# Patient Record
Sex: Male | Born: 1990 | Race: White | Hispanic: No | Marital: Single | Smoking: Former smoker
Health system: Southern US, Community
[De-identification: ages and names within clinical notes are randomized; demographics above are authoritative.]

## PROBLEM LIST (undated history)

## (undated) DIAGNOSIS — F329 Major depressive disorder, single episode, unspecified: Secondary | ICD-10-CM

## (undated) DIAGNOSIS — K859 Acute pancreatitis without necrosis or infection, unspecified: Secondary | ICD-10-CM

## (undated) DIAGNOSIS — I10 Essential (primary) hypertension: Secondary | ICD-10-CM

## (undated) DIAGNOSIS — Z5189 Encounter for other specified aftercare: Secondary | ICD-10-CM

## (undated) DIAGNOSIS — T8859XA Other complications of anesthesia, initial encounter: Secondary | ICD-10-CM

## (undated) DIAGNOSIS — G8929 Other chronic pain: Secondary | ICD-10-CM

## (undated) DIAGNOSIS — F32A Depression, unspecified: Secondary | ICD-10-CM

## (undated) DIAGNOSIS — K219 Gastro-esophageal reflux disease without esophagitis: Secondary | ICD-10-CM

## (undated) DIAGNOSIS — K259 Gastric ulcer, unspecified as acute or chronic, without hemorrhage or perforation: Secondary | ICD-10-CM

## (undated) DIAGNOSIS — F419 Anxiety disorder, unspecified: Secondary | ICD-10-CM

## (undated) DIAGNOSIS — F319 Bipolar disorder, unspecified: Secondary | ICD-10-CM

## (undated) DIAGNOSIS — F3181 Bipolar II disorder: Secondary | ICD-10-CM

## (undated) DIAGNOSIS — R569 Unspecified convulsions: Secondary | ICD-10-CM

## (undated) DIAGNOSIS — K759 Inflammatory liver disease, unspecified: Secondary | ICD-10-CM

## (undated) HISTORY — PX: UPPER GASTROINTESTINAL ENDOSCOPY: SHX188

## (undated) HISTORY — PX: SHOULDER SURGERY: SHX246

## (undated) HISTORY — PX: ELBOW SURGERY: SHX618

## (undated) HISTORY — DX: Anxiety disorder, unspecified: F41.9

## (undated) HISTORY — DX: Encounter for other specified aftercare: Z51.89

## (undated) HISTORY — PX: HAND SURGERY: SHX662

## (undated) HISTORY — PX: LAPAROTOMY: SHX154

## (undated) HISTORY — DX: Gastro-esophageal reflux disease without esophagitis: K21.9

## (undated) HISTORY — PX: ABDOMINAL SURGERY: SHX537

## (undated) HISTORY — PX: COLON SURGERY: SHX602

## (undated) HISTORY — PX: APPENDECTOMY: SHX54

---
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
## 2009-02-15 HISTORY — PX: FLEXIBLE SIGMOIDOSCOPY: SHX1649

## 2010-02-15 DIAGNOSIS — K3184 Gastroparesis: Secondary | ICD-10-CM

## 2010-02-15 HISTORY — PX: LAPAROSCOPIC APPENDECTOMY: SUR753

## 2010-02-15 HISTORY — DX: Gastroparesis: K31.84

## 2010-02-15 HISTORY — PX: EXPLORATORY LAPAROTOMY: SUR591

## 2013-01-15 HISTORY — PX: ESOPHAGOGASTRODUODENOSCOPY: SHX1529

## 2013-02-15 DIAGNOSIS — F1910 Other psychoactive substance abuse, uncomplicated: Secondary | ICD-10-CM

## 2013-02-15 HISTORY — DX: Other psychoactive substance abuse, uncomplicated: F19.10

## 2013-11-15 HISTORY — PX: ESOPHAGOGASTRODUODENOSCOPY: SHX1529

## 2014-06-08 ENCOUNTER — Emergency Department (HOSPITAL_COMMUNITY): Payer: Self-pay

## 2014-06-08 ENCOUNTER — Encounter (HOSPITAL_COMMUNITY): Payer: Self-pay | Admitting: Emergency Medicine

## 2014-06-08 ENCOUNTER — Emergency Department (HOSPITAL_COMMUNITY)
Admission: EM | Admit: 2014-06-08 | Discharge: 2014-06-08 | Discharge status: Against Medical Advice | Payer: Self-pay | Attending: Emergency Medicine | Admitting: Emergency Medicine

## 2014-06-08 DIAGNOSIS — Z87891 Personal history of nicotine dependence: Secondary | ICD-10-CM | POA: Insufficient documentation

## 2014-06-08 DIAGNOSIS — R569 Unspecified convulsions: Secondary | ICD-10-CM

## 2014-06-08 DIAGNOSIS — F419 Anxiety disorder, unspecified: Secondary | ICD-10-CM | POA: Insufficient documentation

## 2014-06-08 DIAGNOSIS — R52 Pain, unspecified: Secondary | ICD-10-CM

## 2014-06-08 DIAGNOSIS — G40909 Epilepsy, unspecified, not intractable, without status epilepticus: Secondary | ICD-10-CM | POA: Insufficient documentation

## 2014-06-08 DIAGNOSIS — Z880 Allergy status to penicillin: Secondary | ICD-10-CM | POA: Insufficient documentation

## 2014-06-08 DIAGNOSIS — R0602 Shortness of breath: Secondary | ICD-10-CM | POA: Insufficient documentation

## 2014-06-08 DIAGNOSIS — R000 Tachycardia, unspecified: Secondary | ICD-10-CM | POA: Insufficient documentation

## 2014-06-08 DIAGNOSIS — Z79899 Other long term (current) drug therapy: Secondary | ICD-10-CM | POA: Insufficient documentation

## 2014-06-08 HISTORY — DX: Unspecified convulsions: R56.9

## 2014-06-08 LAB — COMPREHENSIVE METABOLIC PANEL
ALT: 16 U/L (ref 0–53)
AST: 42 U/L — ABNORMAL HIGH (ref 0–37)
Albumin: 5.1 g/dL (ref 3.5–5.2)
Alkaline Phosphatase: 58 U/L (ref 39–117)
Anion gap: 13 (ref 5–15)
BUN: 17 mg/dL (ref 6–23)
CO2: 23 mmol/L (ref 19–32)
Calcium: 10.2 mg/dL (ref 8.4–10.5)
Chloride: 103 mmol/L (ref 96–112)
Creatinine, Ser: 1.05 mg/dL (ref 0.50–1.35)
GFR calc Af Amer: >90 mL/min (ref >90)
GFR calc non Af Amer: >90 mL/min (ref >90)
Glucose, Bld: 91 mg/dL (ref 70–99)
Potassium: 4.9 mmol/L (ref 3.5–5.1)
Sodium: 139 mmol/L (ref 135–145)
Total Bilirubin: 1.5 mg/dL — ABNORMAL HIGH (ref 0.3–1.2)
Total Protein: 8.4 g/dL — ABNORMAL HIGH (ref 6.0–8.3)

## 2014-06-08 LAB — CBC WITH DIFFERENTIAL/PLATELET
Basophils Absolute: 0 10*3/uL (ref 0.0–0.1)
Basophils Relative: 0 % (ref 0–1)
Eosinophils Absolute: 0.2 10*3/uL (ref 0.0–0.7)
Eosinophils Relative: 2 % (ref 0–5)
HCT: 45.3 % (ref 39.0–52.0)
Hemoglobin: 15.9 g/dL (ref 13.0–17.0)
Lymphocytes Relative: 36 % (ref 12–46)
Lymphs Abs: 3.5 10*3/uL (ref 0.7–4.0)
MCH: 29.1 pg (ref 26.0–34.0)
MCHC: 35.1 g/dL (ref 30.0–36.0)
MCV: 83 fL (ref 78.0–100.0)
Monocytes Absolute: 0.6 10*3/uL (ref 0.1–1.0)
Monocytes Relative: 6 % (ref 3–12)
Neutro Abs: 5.5 10*3/uL (ref 1.7–7.7)
Neutrophils Relative %: 56 % (ref 43–77)
Platelets: 334 10*3/uL (ref 150–400)
RBC: 5.46 MIL/uL (ref 4.22–5.81)
RDW: 12.7 % (ref 11.5–15.5)
WBC: 9.9 10*3/uL (ref 4.0–10.5)

## 2014-06-08 LAB — URINALYSIS, ROUTINE W REFLEX MICROSCOPIC
Glucose, UA: NEGATIVE mg/dL
Hgb urine dipstick: NEGATIVE
Ketones, ur: NEGATIVE mg/dL
Leukocytes, UA: NEGATIVE
Nitrite: NEGATIVE
Protein, ur: NEGATIVE mg/dL
Specific Gravity, Urine: 1.028 (ref 1.005–1.030)
Urobilinogen, UA: 0.2 mg/dL (ref 0.0–1.0)
pH: 5 (ref 5.0–8.0)

## 2014-06-08 LAB — I-STAT CG4 LACTIC ACID, ED: Lactic Acid, Venous: 1.17 mmol/L (ref 0.5–2.0)

## 2014-06-08 LAB — ETHANOL: Alcohol, Ethyl (B): <5 mg/dL (ref 0–9)

## 2014-06-08 LAB — ACETAMINOPHEN LEVEL: Acetaminophen (Tylenol), Serum: <10 ug/mL — ABNORMAL LOW (ref 10–30)

## 2014-06-08 MED ORDER — ONDANSETRON HCL 4 MG/2ML IJ SOLN
4.0000 mg | Freq: Once | INTRAMUSCULAR | Status: DC
  Filled 2014-06-08: qty 2

## 2014-06-08 MED ORDER — SODIUM CHLORIDE 0.9 % IV BOLUS (SEPSIS)
1000.0000 mL | Freq: Once | INTRAVENOUS | Status: AC
  Administered 2014-06-08: 1000 mL via INTRAVENOUS

## 2014-06-08 MED ORDER — HALOPERIDOL LACTATE 5 MG/ML IJ SOLN
2.0000 mg | Freq: Once | INTRAMUSCULAR | Status: AC
  Administered 2014-06-08: 2 mg via INTRAVENOUS
  Filled 2014-06-08: qty 1

## 2014-06-08 MED ORDER — HYDROMORPHONE HCL 1 MG/ML IJ SOLN
1.0000 mg | Freq: Once | INTRAMUSCULAR | Status: AC
  Administered 2014-06-08: 1 mg via INTRAVENOUS
  Filled 2014-06-08: qty 1

## 2014-06-08 NOTE — ED Provider Notes (Signed)
CSN: [id number]     Arrival date & time 06/08/14  1758 History   First MD Initiated Contact with Patient 06/08/14 1808     Chief Complaint  Patient presents with  . Abdominal Pain   Patient is a 24 y.o. male presenting with abdominal pain. The history is provided by the patient, a friend and the EMS personnel.  Abdominal Pain Pain location:  Generalized Pain quality comment:  Unable to specify Pain radiates to:  Does not radiate Pain severity:  Severe Onset quality:  Gradual Duration:  1 hour Timing:  Constant Progression:  Unchanged Chronicity:  New Context comment:  Stung by bee Relieved by:  None tried Worsened by:  Nothing tried Ineffective treatments: benadryl. Associated symptoms: shortness of breath   Associated symptoms: no diarrhea and no vomiting   Shortness of breath:    Severity:  Severe   Onset quality:  Sudden   Duration:  1 hour   Timing:  Constant   Progression:  Resolved Risk factors: multiple surgeries     Past Medical History  Diagnosis Date  . Seizures    Past Surgical History  Procedure Laterality Date  . Abdominal surgery    . Colon surgery     No family history on file. History  Substance Use Topics  . Smoking status: Former Smoker -- 0.50 packs/day    Types: Cigarettes  . Smokeless tobacco: Not on file  . Alcohol Use: No    Review of Systems  Constitutional: Negative for activity change.  HENT: Negative for drooling and trouble swallowing.   Eyes: Negative for redness.  Respiratory: Positive for shortness of breath.   Gastrointestinal: Positive for abdominal pain. Negative for vomiting and diarrhea.  Genitourinary: Negative for difficulty urinating.  Musculoskeletal: Negative for gait problem.  Skin: Negative for rash.  Neurological: Positive for seizures.  Psychiatric/Behavioral: Negative for confusion.      Allergies  Bee venom; Ondansetron; and Penicillins  Home Medications   Prior to Admission medications   Medication  Sig Start Date End Date Taking? Authorizing Provider  ALPRAZolam Duanne Moron) 1 MG tablet Take 1 mg by mouth 3 (three) times daily.   Yes Historical Provider, MD  clonazePAM (KLONOPIN) 0.5 MG tablet Take 0.5 mg by mouth at bedtime. Take every day per patient   Yes Historical Provider, MD  loratadine (CLARITIN) 10 MG tablet Take 10 mg by mouth daily.   Yes Historical Provider, MD  omeprazole (PRILOSEC) 20 MG capsule Take 20 mg by mouth daily.   Yes Historical Provider, MD  oxycodone (ROXICODONE) 30 MG immediate release tablet Take 30 mg by mouth every 4 (four) hours as needed for pain.   Yes Historical Provider, MD  zolpidem (AMBIEN) 10 MG tablet Take 10 mg by mouth at bedtime as needed for sleep.   Yes Historical Provider, MD   BP 160/107 mmHg  Pulse 96  Temp(Src) 99.6 F (37.6 C) (Oral)  Resp 17  Ht 6' (1.829 m)  Wt 208 lb (94.348 kg)  BMI 28.20 kg/m2  SpO2 98% Physical Exam  Constitutional: He is oriented to person, place, and time. He appears well-developed and well-nourished. He appears distressed.  HENT:  Head: Normocephalic and atraumatic.  Right Ear: External ear normal.  Left Ear: External ear normal.  Mouth/Throat: Oropharynx is clear and moist.  Neck: Normal range of motion.  Cardiovascular: Regular rhythm.  Tachycardia present.   Pulses:      Radial pulses are 2+ on the right side, and 2+ on the left  side.       Dorsalis pedis pulses are 2+ on the right side, and 2+ on the left side.  No peripheral edema  Pulmonary/Chest: Effort normal and breath sounds normal. No stridor. No respiratory distress. He has no wheezes.  Abdominal: Bowel sounds are normal. There is tenderness (diffuse).  Midline scar over lower abdomen; voluntary guarding  Musculoskeletal:       Cervical back: He exhibits no bony tenderness.       Thoracic back: He exhibits no bony tenderness.       Lumbar back: He exhibits no bony tenderness.  Neurological: He is alert and oriented to person, place, and time.  GCS eye subscore is 4. GCS verbal subscore is 5. GCS motor subscore is 6.  Moves all extremities spontaneously and to command  Skin: Skin is warm and dry. No rash noted. He is not diaphoretic. No pallor.  Psychiatric: His mood appears anxious.    ED Course  Procedures (including critical care time) Labs Review Labs Reviewed  COMPREHENSIVE METABOLIC PANEL - Abnormal; Notable for the following:    Total Protein 8.4 (*)    AST 42 (*)    Total Bilirubin 1.5 (*)    All other components within normal limits  ACETAMINOPHEN LEVEL - Abnormal; Notable for the following:    Acetaminophen (Tylenol), Serum <10.0 (*)    All other components within normal limits  URINALYSIS, ROUTINE W REFLEX MICROSCOPIC - Abnormal; Notable for the following:    Color, Urine AMBER (*)    APPearance CLOUDY (*)    Bilirubin Urine SMALL (*)    All other components within normal limits  CBC WITH DIFFERENTIAL/PLATELET  ETHANOL  I-STAT CG4 LACTIC ACID, ED    Imaging Review Dg Chest Port 1 View  06/08/2014   CLINICAL DATA:  Seizure.  EXAM: PORTABLE CHEST - 1 VIEW  COMPARISON:  None.  FINDINGS: The heart size and mediastinal contours are within normal limits. Both lungs are clear. The visualized skeletal structures are unremarkable.  IMPRESSION: No active disease.   Electronically Signed   By: Rolla Flatten M.D.   On: 06/08/2014 19:14     EKG Interpretation None      MDM   Final diagnoses:  Seizure   24 y.o. male with a past medical history of PTSD and reported seizures presents to the ED with 2 friends after a reported bee sting 1 hour prior to arrival. 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 Reports he took 4 Benadryl. No nausea, vomiting. Endorses severe abdominal pain that began after the bee sting. Friends report that the patient had "seizure-like activity". In triage while having the seizure-like activity he was conversant and ambulatory.  Exam as above. Absolutely no sign of anaphylaxis.  No stridor or wheezing, nausea, vomiting, rash. No oropharyngeal edema. No facial swelling. Site of the bee sting is mildly erythematous with no streaking erythema or edema. Abdomen has midline lower vertical scar. Has voluntary guarding. He has no evidence of seizure activity. No oral trauma. No bowel or bladder incontinence.  Based on previous abdominal surgery with severe abdominal pain we'll obtain a CT abdomen and pelvis with contrast. Concern for possible small bowel obstruction. CBC unremarkable. CMP notable for mildly elevated AST and a mildly elevated total bilirubin. Urinalysis shows no infection. Lactic acid within normal limits. He was given 1 L IV fluids and IV pain medication. He refused IV Zofran stating that he was allergic to it. He requested Phenergan.  Offered a Phenergan suppository however he refused. Was given low-dose Haldol for his nausea, vomiting, abdominal pain, and reported agitation with CT scans.  8:52 PM The patient expressed a desire to leave [place] prior to getting his CT scan. He had been given IV pain medication, IV fluids, as well as IV Haldol to help with his nausea vomiting and abdominal pain. I explained the risks of leaving to him including death and permanent disability. He does not appear to be under the influence of any alcohol or mind altering substances. He does not appear to have any head trauma. He is ambulatory with a steady gait and appears to be in no distress. His heart rate has normalized. I emphasized return precautions and that we are always happy to see him back in the ED at any time should he desire to seek care again.  This case was managed in conjunction with my attending, Dr. Pattricia Boss.    Berenice Primas, MD 06/08/14 [id number]  Pattricia Boss, MD 06/09/14 4193

## 2014-06-08 NOTE — ED Notes (Signed)
CT called to make aware pt is ready for ct. To give haldol before CT scan

## 2014-06-08 NOTE — ED Notes (Signed)
Per pt,  Pt had a bee sting 1 hour ago and took some benadryl - pt known to be allergic to bees, edematous airway with sob, pt at this time not having any airway compromise- also after bee sting pt began having "severe" abd pain, abdomen is tense. Pt is trembling, tachycardic at a rate of 150 but regular NSR. Other VSS. Pt is alert and oriented x 4. Pt friends state pt had seizure like activity, staff has not witnessed seizure activity.

## 2014-06-08 NOTE — ED Notes (Signed)
Pt refusing to go to CT until he gets" something else for pain or to sedate me". Dr.ray made aware.

## 2014-06-08 NOTE — ED Notes (Addendum)
Ct had arrived to take pt to cT scan and pt jumped out of bed and ripped out his IV and stated that he was leaving and was not going to have a CT.. Dr.batista came into room to explain the pt risks of leaving including death and permanent disablilty. Pt refuses to stay. Bleeding was controlled where iv was removed, bandaged given. Pt left with friends.

## 2014-06-08 NOTE — ED Notes (Signed)
MD Ray at bedside. 2 friends at bedside explaining possible seizure episode that lasted 5-10 minutes. Friends state "pt was out of it after the seizure." Pt has been more drowsy with [place] but easily arousable with speech. Pt has extensive surgical hx of abdomen. Abdomen is extremely rigid, bowel sounds are present. Also pt states he took 4 benadryl after the bee sting he experienced once he started feeling like his airway was closing off.

## 2014-06-08 NOTE — ED Notes (Signed)
Port chest xray at bedside in process at current time. Pt hr down to 110

## 2014-10-17 DIAGNOSIS — R768 Other specified abnormal immunological findings in serum: Secondary | ICD-10-CM

## 2014-10-17 HISTORY — PX: ESOPHAGOGASTRODUODENOSCOPY: SHX1529

## 2014-10-17 HISTORY — DX: Other specified abnormal immunological findings in serum: R76.8

## 2015-02-16 DIAGNOSIS — K852 Alcohol induced acute pancreatitis without necrosis or infection: Secondary | ICD-10-CM

## 2015-02-16 HISTORY — DX: Alcohol induced acute pancreatitis without necrosis or infection: K85.20

## 2016-01-16 HISTORY — PX: ESOPHAGOGASTRODUODENOSCOPY: SHX1529

## 2019-07-13 ENCOUNTER — Encounter (HOSPITAL_COMMUNITY): Payer: Self-pay

## 2019-07-13 ENCOUNTER — Other Ambulatory Visit: Payer: Self-pay

## 2019-07-13 ENCOUNTER — Ambulatory Visit (HOSPITAL_COMMUNITY)
Admission: EM | Admit: 2019-07-13 | Discharge: 2019-07-13 | Disposition: A | Discharge status: Home / Self Care | Payer: Self-pay | Attending: Family Medicine | Admitting: Family Medicine

## 2019-07-13 DIAGNOSIS — B86 Scabies: Secondary | ICD-10-CM

## 2019-07-13 DIAGNOSIS — M545 Low back pain, unspecified: Secondary | ICD-10-CM

## 2019-07-13 DIAGNOSIS — I10 Essential (primary) hypertension: Secondary | ICD-10-CM

## 2019-07-13 DIAGNOSIS — B182 Chronic viral hepatitis C: Secondary | ICD-10-CM

## 2019-07-13 DIAGNOSIS — G8929 Other chronic pain: Secondary | ICD-10-CM

## 2019-07-13 HISTORY — DX: Depression, unspecified: F32.A

## 2019-07-13 HISTORY — DX: Essential (primary) hypertension: I10

## 2019-07-13 HISTORY — DX: Gastric ulcer, unspecified as acute or chronic, without hemorrhage or perforation: K25.9

## 2019-07-13 MED ORDER — HYDROXYZINE HCL 25 MG PO TABS: 25.0000 mg | ORAL_TABLET | ORAL | 0 refills | Status: DC | PRN

## 2019-07-13 MED ORDER — TIZANIDINE HCL 4 MG PO TABS: 4.0000 mg | ORAL_TABLET | Freq: Four times a day (QID) | ORAL | 0 refills | Status: DC | PRN

## 2019-07-13 MED ORDER — PERMETHRIN 5 % EX CREA: TOPICAL_CREAM | CUTANEOUS | 1 refills | Status: DC

## 2019-07-13 NOTE — ED Notes (Signed)
BP reported to Dr. Meda Coffee.

## 2019-07-13 NOTE — ED Provider Notes (Signed)
[place]    CSN: [id number] Arrival date & time: 07/13/19  1458      History   Chief Complaint Chief Complaint  Patient presents with  . Rash  . Nausea  . Back Pain    HPI Walter Kemp is a 29 y.o. male.   HPI  Patient states that he is living in a home for substance abuse recovery.  He is working as a Dealer. He is here for a rash on his hands and armpits.  Is been present for the last 3 days.  Is getting progressively worse.  It is intensely itchy.  It is not responding to Benadryl cream or hydrocortisone cream.  He has never had a similar rash before.  He does not know of anyone else at the home who has a rash. He also complains of chronic low back pain.  He wishes referral to a spine specialist. He also complains of headache.  His blood pressure is elevated today.  He takes lisinopril 30 mg a day.  He states he is compliant with his medication.  He is advised that he needs primary care follow-up regarding his blood pressure.  He declines additional medication for me today. Patient is under the care of a counselor for his bipolar illness and substance abuse. Patient was not truthful with me.  He told me he had not used any opiates for "217 days".  As I look at the medical record he was just admitted in February for detoxification with acute opioid withdrawal.  I did not challenge him with this discrepancy since he is here for a rash He has known hepatitis C.  He has chronic nausea.   Past Medical History:  Diagnosis Date  . Bipolar affective disorder ([place])   . Depression   . Hypertension   . Pancreatitis   . Seizures ([place])   . Stomach ulcer     There are no problems to display for this patient.   Past Surgical History:  Procedure Laterality Date  . ABDOMINAL SURGERY    . APPENDECTOMY    . COLON SURGERY    . HAND SURGERY    . LAPAROTOMY    . SHOULDER SURGERY         Home Medications    Prior to Admission medications   Medication Sig  Start Date End Date Taking? Authorizing Provider  ARIPiprazole (ABILIFY) 5 MG tablet Take by mouth. 07/12/19 08/10/19 Yes [provider]  atorvastatin (LIPITOR) 20 MG tablet Take by mouth. 03/06/19  Yes [provider]  buPROPion (WELLBUTRIN SR) 150 MG 12 hr tablet Take by mouth. 07/12/19 08/10/19 Yes [provider]  calcium carbonate (OS-CAL) 1250 (500 Ca) MG chewable tablet Chew by mouth.   Yes [provider]  clonazePAM (KLONOPIN) 0.5 MG tablet Take 0.5 mg by mouth at bedtime. Take every day per patient   Yes [provider]  gabapentin (NEURONTIN) 400 MG capsule Take by mouth. 07/12/19 08/10/19 Yes [provider]  lisinopril (ZESTRIL) 30 MG tablet Take 30 mg by mouth daily.   Yes [provider]  loratadine (CLARITIN) 10 MG tablet Take 10 mg by mouth daily.   Yes [provider]  NARCAN 4 MG/0.1ML LIQD nasal spray kit USE AS DIRECTED BY PROVIDER 04/30/19  Yes [provider]  pantoprazole (PROTONIX) 40 MG tablet Take 40 mg by mouth daily.   Yes [provider]  prazosin (MINIPRESS) 1 MG capsule Take 1 mg by mouth at bedtime. 04/10/19  Yes [provider]  QUEtiapine (SEROQUEL) 100 MG tablet Take by mouth. 07/12/19 08/10/19 Yes [provider]  omeprazole (PRILOSEC) 20 MG capsule Take 20 mg by mouth daily.  07/13/19 Yes [provider]  hydrOXYzine (ATARAX/VISTARIL) 25 MG tablet Take 1-2 tablets (25-50 mg total) by mouth every 4 (four) hours as needed. 07/13/19   Raylene Everts, MD  permethrin (ELIMITE) 5 % cream Apply to affected area once.  Repeat in one week if needed 07/13/19   Raylene Everts, MD  tiZANidine (ZANAFLEX) 4 MG tablet Take 1-2 tablets (4-8 mg total) by mouth every 6 (six) hours as needed for muscle spasms. 07/13/19   Raylene Everts, MD  zolpidem (AMBIEN) 10 MG tablet Take 10 mg by mouth at bedtime as needed for sleep.  07/13/19  [provider]     Family History History reviewed. No pertinent family history.  Social History Social History   Tobacco Use  . Smoking status: Former Smoker    Packs/day: 0.50    Types: Cigarettes  . Smokeless tobacco: Never Used  Substance Use Topics  . Alcohol use: No  . Drug use: Not on file     Allergies   Iodinated diagnostic agents, Shellfish allergy, Vancomycin, Nsaids, Bee venom, Ondansetron, Penicillins, and Metoclopramide   Review of Systems Review of Systems  Gastrointestinal: Positive for nausea.  Musculoskeletal: Positive for back pain.  Skin: Positive for rash.  Neurological: Positive for headaches.     Physical Exam Triage Vital Signs ED Triage Vitals  Enc Vitals Group     BP 07/13/19 1633 (!) 175/124     Pulse Rate 07/13/19 1633 90     Resp 07/13/19 1633 18     Temp 07/13/19 1633 98.8 F (37.1 C)     Temp Source 07/13/19 1633 Oral     SpO2 07/13/19 1633 100 %     Weight --      Height --      Head Circumference --      Peak Flow --      Pain Score 07/13/19 1625 8     Pain Loc --      Pain Edu? --      Excl. in [place]? --    No data found.  Updated Vital Signs BP (!) 175/124 (BP Location: Left Arm)   Pulse 90   Temp 98.8 F (37.1 C) (Oral)   Resp 18   SpO2 100%      Physical Exam Constitutional:      General: He is not in acute distress.    Appearance: He is well-developed.     Comments: Unable to stop scratching  HENT:     Head: Normocephalic and atraumatic.     Nose:     Comments: Mask is in place Eyes:     Conjunctiva/sclera: Conjunctivae normal.     Pupils: Pupils are equal, round, and reactive to light.  Cardiovascular:     Rate and Rhythm: Normal rate.  Pulmonary:     Effort: Pulmonary effort is normal. No respiratory distress.  Musculoskeletal:        General: Normal range of motion.     Cervical back: Normal range of motion.     Comments: Back is straight and symmetric.  Gait is normal  Skin:    General: Skin is warm and dry.      Comments: The hands have multiple excoriated lesions.  There are multiple papules between the fingers, there is a few  linear plaques.  Scattered papules in the axilla as well  Neurological:     General: No focal deficit present.     Mental Status: He is alert.      UC Treatments / Results  Labs (all labs ordered are listed, but only abnormal results are displayed) Labs Reviewed - No data to display  EKG   Radiology No results found.  Procedures Procedures (including critical care time)  Medications Ordered in UC Medications - No data to display  Initial Impression / Assessment and Plan / UC Course  I have reviewed the triage vital signs and the nursing notes.  Pertinent labs & imaging results that were available during my care of the patient were reviewed by me and considered in my medical decision making (see chart for details).     Discussed the importance of fumigating his room, cleaning all of his laundry, and treating his skin at the same time Patient wishes referral for his back pain and for his hepatitis.  I have given him the number for the wellness clinic for primary care so they can attend to his needs Final Clinical Impressions(s) / UC Diagnoses   Final diagnoses:  Scabies  Chronic midline low back pain without sciatica  Essential hypertension  Chronic hepatitis C without hepatic coma (HCC)     Discharge Instructions     Use the permethrin lotion as directed.  Apply from neck down and wash off the next morning.  Read the scabies instructions.  You must launder all of your bedding. Take hydroxyzine as needed for itching Use cortisone cream on the rash, also will help with your itching Take tizanidine as needed as muscle relaxer.  This is helpful at bedtime You need to make an appointment for primary care at the [facility].  From there they can refer you to gastroenterology for your hepatitis C, and to a spine  specialist   ED Prescriptions    Medication Sig Dispense Auth. Provider   permethrin (ELIMITE) 5 % cream Apply to affected area once.  Repeat in one week if needed 60 g Raylene Everts, MD   tiZANidine (ZANAFLEX) 4 MG tablet Take 1-2 tablets (4-8 mg total) by mouth every 6 (six) hours as needed for muscle spasms. 21 tablet Raylene Everts, MD   hydrOXYzine (ATARAX/VISTARIL) 25 MG tablet Take 1-2 tablets (25-50 mg total) by mouth every 4 (four) hours as needed. 20 tablet Raylene Everts, MD     PDMP not reviewed this encounter.   Raylene Everts, MD 07/13/19 [phone]

## 2019-07-13 NOTE — ED Triage Notes (Addendum)
Pt reports he started having itchy rash in the hands and arms 3 days ago, pt denies any new changes in detergents, soap or food. Pt states he stopped taking Lamictal yesterday after 2 years using it. Pt complaints of headaches and lower back x 3 days. Benadryl cream and Hydrocortisone cream gave not relief.

## 2019-07-13 NOTE — Discharge Instructions (Signed)
Use the permethrin lotion as directed.  Apply from neck down and wash off the next morning.  Read the scabies instructions.  You must launder all of your bedding. Take hydroxyzine as needed for itching Use cortisone cream on the rash, also will help with your itching Take tizanidine as needed as muscle relaxer.  This is helpful at bedtime You need to make an appointment for primary care at the [facility].  From there they can refer you to gastroenterology for your hepatitis C, and to a spine specialist

## 2019-08-04 ENCOUNTER — Other Ambulatory Visit: Payer: Self-pay

## 2019-08-04 ENCOUNTER — Encounter (HOSPITAL_COMMUNITY): Payer: Self-pay

## 2019-08-04 DIAGNOSIS — Z87891 Personal history of nicotine dependence: Secondary | ICD-10-CM

## 2019-08-04 DIAGNOSIS — F064 Anxiety disorder due to known physiological condition: Secondary | ICD-10-CM | POA: Diagnosis present

## 2019-08-04 DIAGNOSIS — I10 Essential (primary) hypertension: Secondary | ICD-10-CM | POA: Diagnosis present

## 2019-08-04 DIAGNOSIS — Z20822 Contact with and (suspected) exposure to covid-19: Secondary | ICD-10-CM | POA: Diagnosis present

## 2019-08-04 DIAGNOSIS — Z881 Allergy status to other antibiotic agents status: Secondary | ICD-10-CM

## 2019-08-04 DIAGNOSIS — K2100 Gastro-esophageal reflux disease with esophagitis, without bleeding: Secondary | ICD-10-CM | POA: Diagnosis present

## 2019-08-04 DIAGNOSIS — F1910 Other psychoactive substance abuse, uncomplicated: Secondary | ICD-10-CM | POA: Diagnosis present

## 2019-08-04 DIAGNOSIS — F319 Bipolar disorder, unspecified: Secondary | ICD-10-CM | POA: Diagnosis present

## 2019-08-04 DIAGNOSIS — F4310 Post-traumatic stress disorder, unspecified: Secondary | ICD-10-CM | POA: Diagnosis present

## 2019-08-04 DIAGNOSIS — K2901 Acute gastritis with bleeding: Principal | ICD-10-CM | POA: Diagnosis present

## 2019-08-04 DIAGNOSIS — Z91013 Allergy to seafood: Secondary | ICD-10-CM

## 2019-08-04 DIAGNOSIS — Z8711 Personal history of peptic ulcer disease: Secondary | ICD-10-CM

## 2019-08-04 DIAGNOSIS — G40909 Epilepsy, unspecified, not intractable, without status epilepticus: Secondary | ICD-10-CM | POA: Diagnosis present

## 2019-08-04 DIAGNOSIS — B182 Chronic viral hepatitis C: Secondary | ICD-10-CM | POA: Diagnosis present

## 2019-08-04 DIAGNOSIS — Z79899 Other long term (current) drug therapy: Secondary | ICD-10-CM

## 2019-08-04 DIAGNOSIS — K567 Ileus, unspecified: Secondary | ICD-10-CM | POA: Diagnosis not present

## 2019-08-04 DIAGNOSIS — R238 Other skin changes: Secondary | ICD-10-CM | POA: Diagnosis present

## 2019-08-04 LAB — CBC
HCT: 43.1 % (ref 39.0–52.0)
Hemoglobin: 14.7 g/dL (ref 13.0–17.0)
MCH: 28.6 pg (ref 26.0–34.0)
MCHC: 34.1 g/dL (ref 30.0–36.0)
MCV: 83.9 fL (ref 80.0–100.0)
Platelets: 473 10*3/uL — ABNORMAL HIGH (ref 150–400)
RBC: 5.14 MIL/uL (ref 4.22–5.81)
RDW: 14.3 % (ref 11.5–15.5)
WBC: 11.6 10*3/uL — ABNORMAL HIGH (ref 4.0–10.5)
nRBC: 0 % (ref 0.0–0.2)

## 2019-08-04 NOTE — ED Triage Notes (Signed)
Arrived POV from home. Patient reports vomiting coffee ground emesis that started 3 days ago, now vomit has become "bright red". Patient also reports sharp abdomina pain.

## 2019-08-05 ENCOUNTER — Observation Stay (HOSPITAL_COMMUNITY): Payer: Self-pay | Admitting: Anesthesiology

## 2019-08-05 ENCOUNTER — Inpatient Hospital Stay (HOSPITAL_COMMUNITY)
Admission: AD | Admit: 2019-08-05 | Discharge: 2019-08-07 | DRG: 378 | Disposition: A | Discharge status: Home / Self Care | Payer: Self-pay | Attending: Family Medicine | Admitting: Family Medicine

## 2019-08-05 ENCOUNTER — Other Ambulatory Visit: Payer: Self-pay

## 2019-08-05 ENCOUNTER — Encounter (HOSPITAL_COMMUNITY): Payer: Self-pay | Admitting: Family Medicine

## 2019-08-05 ENCOUNTER — Emergency Department (HOSPITAL_COMMUNITY): Payer: Self-pay

## 2019-08-05 ENCOUNTER — Encounter (HOSPITAL_COMMUNITY)
Admission: AD | Disposition: A | Discharge status: Home / Self Care | Payer: Self-pay | Source: Home / Self Care | Attending: Family Medicine

## 2019-08-05 ENCOUNTER — Observation Stay (HOSPITAL_COMMUNITY): Payer: Self-pay

## 2019-08-05 DIAGNOSIS — K829 Disease of gallbladder, unspecified: Secondary | ICD-10-CM

## 2019-08-05 DIAGNOSIS — K922 Gastrointestinal hemorrhage, unspecified: Secondary | ICD-10-CM

## 2019-08-05 DIAGNOSIS — R109 Unspecified abdominal pain: Secondary | ICD-10-CM

## 2019-08-05 DIAGNOSIS — R1013 Epigastric pain: Secondary | ICD-10-CM

## 2019-08-05 DIAGNOSIS — I10 Essential (primary) hypertension: Secondary | ICD-10-CM | POA: Diagnosis present

## 2019-08-05 DIAGNOSIS — K920 Hematemesis: Secondary | ICD-10-CM | POA: Diagnosis present

## 2019-08-05 DIAGNOSIS — K8020 Calculus of gallbladder without cholecystitis without obstruction: Secondary | ICD-10-CM

## 2019-08-05 DIAGNOSIS — F319 Bipolar disorder, unspecified: Secondary | ICD-10-CM | POA: Diagnosis present

## 2019-08-05 HISTORY — DX: Bipolar II disorder: F31.81

## 2019-08-05 HISTORY — DX: Other complications of anesthesia, initial encounter: T88.59XA

## 2019-08-05 HISTORY — DX: Other chronic pain: G89.29

## 2019-08-05 HISTORY — PX: ESOPHAGOGASTRODUODENOSCOPY (EGD) WITH PROPOFOL: SHX5813

## 2019-08-05 LAB — COMPREHENSIVE METABOLIC PANEL
ALT: 46 U/L — ABNORMAL HIGH (ref 0–44)
AST: 34 U/L (ref 15–41)
Albumin: 5 g/dL (ref 3.5–5.0)
Alkaline Phosphatase: 60 U/L (ref 38–126)
Anion gap: 14 (ref 5–15)
BUN: 20 mg/dL (ref 6–20)
CO2: 22 mmol/L (ref 22–32)
Calcium: 9.6 mg/dL (ref 8.9–10.3)
Chloride: 105 mmol/L (ref 98–111)
Creatinine, Ser: 1.08 mg/dL (ref 0.61–1.24)
GFR calc Af Amer: >60 mL/min (ref >60)
GFR calc non Af Amer: >60 mL/min (ref >60)
Glucose, Bld: 102 mg/dL — ABNORMAL HIGH (ref 70–99)
Potassium: 3.7 mmol/L (ref 3.5–5.1)
Sodium: 141 mmol/L (ref 135–145)
Total Bilirubin: 0.7 mg/dL (ref 0.3–1.2)
Total Protein: 9 g/dL — ABNORMAL HIGH (ref 6.5–8.1)

## 2019-08-05 LAB — URINALYSIS, ROUTINE W REFLEX MICROSCOPIC
Bilirubin Urine: NEGATIVE
Glucose, UA: NEGATIVE mg/dL
Hgb urine dipstick: NEGATIVE
Ketones, ur: NEGATIVE mg/dL
Leukocytes,Ua: NEGATIVE
Nitrite: NEGATIVE
Protein, ur: NEGATIVE mg/dL
Specific Gravity, Urine: 1.019 (ref 1.005–1.030)
pH: 5 (ref 5.0–8.0)

## 2019-08-05 LAB — POC OCCULT BLOOD, ED: Fecal Occult Bld: POSITIVE — AB

## 2019-08-05 LAB — RAPID URINE DRUG SCREEN, HOSP PERFORMED
Amphetamines: NOT DETECTED
Barbiturates: NOT DETECTED
Benzodiazepines: NOT DETECTED
Cocaine: NOT DETECTED
Opiates: NOT DETECTED
Tetrahydrocannabinol: NOT DETECTED

## 2019-08-05 LAB — HEMATOCRIT
HCT: 37.9 % — ABNORMAL LOW (ref 39.0–52.0)
HCT: 35.1 % — ABNORMAL LOW (ref 39.0–52.0)

## 2019-08-05 LAB — PROTIME-INR
INR: 1.1 (ref 0.8–1.2)
Prothrombin Time: 13.2 seconds (ref 11.4–15.2)

## 2019-08-05 LAB — HIV ANTIBODY (ROUTINE TESTING W REFLEX): HIV Screen 4th Generation wRfx: NONREACTIVE

## 2019-08-05 LAB — HEMOGLOBIN
Hemoglobin: 12.3 g/dL — ABNORMAL LOW (ref 13.0–17.0)
Hemoglobin: 11.5 g/dL — ABNORMAL LOW (ref 13.0–17.0)

## 2019-08-05 LAB — TYPE AND SCREEN
ABO/RH(D): O POS
Antibody Screen: NEGATIVE

## 2019-08-05 LAB — APTT: aPTT: 33 seconds (ref 24–36)

## 2019-08-05 LAB — SARS CORONAVIRUS 2 BY RT PCR (HOSPITAL ORDER, PERFORMED IN ~~LOC~~ HOSPITAL LAB): SARS Coronavirus 2: NEGATIVE

## 2019-08-05 LAB — ABO/RH: ABO/RH(D): O POS

## 2019-08-05 SURGERY — ESOPHAGOGASTRODUODENOSCOPY (EGD) WITH PROPOFOL
Anesthesia: General

## 2019-08-05 SURGERY — EGD (ESOPHAGOGASTRODUODENOSCOPY)
Anesthesia: Monitor Anesthesia Care

## 2019-08-05 MED ORDER — PROMETHAZINE HCL 25 MG/ML IJ SOLN
25.0000 mg | Freq: Once | INTRAMUSCULAR | Status: AC
  Administered 2019-08-05: 25 mg via INTRAMUSCULAR
  Filled 2019-08-05: qty 1

## 2019-08-05 MED ORDER — SODIUM CHLORIDE 0.9% IV SOLUTION: Freq: Once | INTRAVENOUS | Status: DC

## 2019-08-05 MED ORDER — PANTOPRAZOLE SODIUM 40 MG IV SOLR
40.0000 mg | Freq: Two times a day (BID) | INTRAVENOUS | Status: DC
  Filled 2019-08-05: qty 40

## 2019-08-05 MED ORDER — SODIUM CHLORIDE 0.9% FLUSH
3.0000 mL | Freq: Two times a day (BID) | INTRAVENOUS | Status: DC
  Administered 2019-08-05 – 2019-08-07 (×4): 3 mL via INTRAVENOUS

## 2019-08-05 MED ORDER — ZOLPIDEM TARTRATE 5 MG PO TABS
5.0000 mg | ORAL_TABLET | Freq: Every evening | ORAL | Status: DC | PRN
  Administered 2019-08-05 – 2019-08-06 (×2): 5 mg via ORAL
  Filled 2019-08-05 (×2): qty 1

## 2019-08-05 MED ORDER — SODIUM CHLORIDE 0.9 % IV SOLN
80.0000 mg | Freq: Once | INTRAVENOUS | Status: AC
  Administered 2019-08-05: 80 mg via INTRAVENOUS
  Filled 2019-08-05: qty 80

## 2019-08-05 MED ORDER — ONDANSETRON 4 MG PO TBDP: 4.0000 mg | ORAL_TABLET | Freq: Three times a day (TID) | ORAL | Status: DC | PRN

## 2019-08-05 MED ORDER — SUCCINYLCHOLINE CHLORIDE 200 MG/10ML IV SOSY
PREFILLED_SYRINGE | INTRAVENOUS | Status: DC | PRN
  Administered 2019-08-05: 160 mg via INTRAVENOUS

## 2019-08-05 MED ORDER — PROPOFOL 10 MG/ML IV BOLUS
INTRAVENOUS | Status: DC | PRN
  Administered 2019-08-05: 200 mg via INTRAVENOUS

## 2019-08-05 MED ORDER — ACETAMINOPHEN 325 MG PO TABS: 650.0000 mg | ORAL_TABLET | Freq: Four times a day (QID) | ORAL | Status: DC | PRN

## 2019-08-05 MED ORDER — LACTATED RINGERS IV SOLN: INTRAVENOUS | Status: DC

## 2019-08-05 MED ORDER — ACETAMINOPHEN 650 MG RE SUPP: 650.0000 mg | Freq: Four times a day (QID) | RECTAL | Status: DC | PRN

## 2019-08-05 MED ORDER — IOHEXOL 9 MG/ML PO SOLN
ORAL | Status: AC
  Filled 2019-08-05: qty 1000

## 2019-08-05 MED ORDER — PROPOFOL 10 MG/ML IV BOLUS
INTRAVENOUS | Status: AC
  Filled 2019-08-05: qty 20

## 2019-08-05 MED ORDER — MIDAZOLAM HCL 5 MG/5ML IJ SOLN
1.0000 mg | Freq: Once | INTRAMUSCULAR | Status: AC
  Administered 2019-08-05: 1 mg via INTRAVENOUS

## 2019-08-05 MED ORDER — SODIUM CHLORIDE 0.9 % IV BOLUS
1000.0000 mL | Freq: Once | INTRAVENOUS | Status: AC
  Administered 2019-08-05: 1000 mL via INTRAVENOUS

## 2019-08-05 MED ORDER — SODIUM CHLORIDE 0.9 % IV SOLN
8.0000 mg/h | INTRAVENOUS | Status: DC
  Administered 2019-08-05 – 2019-08-06 (×3): 8 mg/h via INTRAVENOUS
  Filled 2019-08-05 (×5): qty 80

## 2019-08-05 MED ORDER — MORPHINE SULFATE (PF) 2 MG/ML IV SOLN
2.0000 mg | INTRAVENOUS | Status: DC | PRN
  Administered 2019-08-05: 4 mg via INTRAVENOUS
  Administered 2019-08-05: 2 mg via INTRAVENOUS
  Filled 2019-08-05 (×2): qty 2
  Filled 2019-08-05: qty 1

## 2019-08-05 MED ORDER — MIDAZOLAM HCL (PF) 5 MG/ML IJ SOLN
INTRAMUSCULAR | Status: AC
  Filled 2019-08-05: qty 1

## 2019-08-05 MED ORDER — DIPHENHYDRAMINE HCL 25 MG PO CAPS
25.0000 mg | ORAL_CAPSULE | Freq: Three times a day (TID) | ORAL | Status: DC | PRN
  Administered 2019-08-05: 25 mg via ORAL
  Filled 2019-08-05: qty 1

## 2019-08-05 MED ORDER — PROMETHAZINE HCL 25 MG/ML IJ SOLN: 25.0000 mg | Freq: Four times a day (QID) | INTRAMUSCULAR | Status: DC | PRN

## 2019-08-05 MED ORDER — FENTANYL CITRATE (PF) 100 MCG/2ML IJ SOLN
INTRAMUSCULAR | Status: DC | PRN
  Administered 2019-08-05: 100 ug via INTRAVENOUS

## 2019-08-05 MED ORDER — MORPHINE SULFATE (PF) 2 MG/ML IV SOLN
2.0000 mg | INTRAVENOUS | Status: DC | PRN
  Administered 2019-08-05 – 2019-08-07 (×8): 2 mg via INTRAVENOUS
  Filled 2019-08-05 (×8): qty 1

## 2019-08-05 MED ORDER — PROMETHAZINE HCL 25 MG/ML IJ SOLN
INTRAMUSCULAR | Status: AC
  Filled 2019-08-05: qty 1

## 2019-08-05 MED ORDER — SODIUM CHLORIDE 0.9 % IV SOLN: INTRAVENOUS | Status: DC

## 2019-08-05 MED ORDER — SODIUM CHLORIDE 0.9 % IV SOLN: INTRAVENOUS | Status: AC

## 2019-08-05 MED ORDER — PROMETHAZINE HCL 25 MG PO TABS
12.5000 mg | ORAL_TABLET | Freq: Three times a day (TID) | ORAL | Status: DC | PRN
  Administered 2019-08-05 – 2019-08-06 (×2): 12.5 mg via ORAL
  Filled 2019-08-05 (×2): qty 1

## 2019-08-05 MED ORDER — FENTANYL CITRATE (PF) 100 MCG/2ML IJ SOLN
INTRAMUSCULAR | Status: AC
  Filled 2019-08-05: qty 2

## 2019-08-05 MED ORDER — MIDAZOLAM HCL 2 MG/2ML IJ SOLN
1.0000 mg | Freq: Once | INTRAMUSCULAR | Status: AC
  Administered 2019-08-05: 1 mg via INTRAVENOUS

## 2019-08-05 MED ORDER — MORPHINE SULFATE (PF) 2 MG/ML IV SOLN
1.0000 mg | INTRAVENOUS | Status: DC | PRN
  Administered 2019-08-05 (×2): 1 mg via INTRAVENOUS
  Filled 2019-08-05: qty 1

## 2019-08-05 MED ORDER — FENTANYL CITRATE (PF) 100 MCG/2ML IJ SOLN
50.0000 ug | Freq: Once | INTRAMUSCULAR | Status: AC
  Administered 2019-08-05: 50 ug via INTRAVENOUS
  Filled 2019-08-05: qty 2

## 2019-08-05 MED ORDER — PROMETHAZINE HCL 25 MG/ML IJ SOLN
25.0000 mg | Freq: Once | INTRAMUSCULAR | Status: AC
  Administered 2019-08-05: 25 mg via INTRAVENOUS

## 2019-08-05 MED ORDER — DIPHENHYDRAMINE HCL 50 MG/ML IJ SOLN
25.0000 mg | Freq: Once | INTRAMUSCULAR | Status: AC
  Administered 2019-08-05: 25 mg via INTRAVENOUS
  Filled 2019-08-05: qty 1

## 2019-08-05 SURGICAL SUPPLY — 14 items

## 2019-08-05 NOTE — ED Provider Notes (Signed)
[facility] DEPT Provider Note   CSN: [id number] Arrival date & time: 08/04/19  2250   Time seen 12:20 AM  History Chief Complaint  Patient presents with  . Hematemesis  . Abdominal Pain    Walter Kemp is a 29 y.o. male.  HPI   Patient states about 3 days ago he started vomiting a lot of bile and acid fluid with green stools.  He was having a lot of abdominal cramping.  He points to his epigastric area.  He states the past 2 days his bowel movements have been black and tarry.  He states about 7:30 PM tonight he started vomiting frank blood.  He states the pain in his epigastric area became worse and now is stabbing and sharp and burning.  He denies any chest pain or pain on swallowing.  He states in 2016 he had a "GI tear and stomach acid".  He also had shrapnel in his abdomen.  He states he had had 9 pieces of shrapnel removed from his abdomen in 2012.  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  He also takes Tylenol but he denies alcohol.  He states he has been taking Protonix 40 mg a day from his physician he had in [place].  Patient states he cannot take Zofran because it makes his heart race and he cannot take Reglan.  He states the nausea medicine that works for him is Phenergan.  PCP Patient, No Pcp Per   Past Medical History:  Diagnosis Date  . Bipolar affective disorder ([place])   . Depression   . Hypertension   . Pancreatitis   . Seizures ([place])   . Stomach ulcer     There are no problems to display for this patient.   Past Surgical History:  Procedure Laterality Date  . ABDOMINAL SURGERY    . APPENDECTOMY    . COLON SURGERY    . HAND SURGERY    . LAPAROTOMY    . SHOULDER SURGERY         No family history on file.  Social History   Tobacco Use  . Smoking status:  Former Smoker    Packs/day: 0.50    Types: Cigarettes  . Smokeless tobacco: Never Used  Substance Use Topics  . Alcohol use: No  . Drug use: Not on file  Moved to [facility] couple months ago from [facility] Medications Prior to Admission medications   Medication Sig Start Date End Date Taking? Authorizing Provider  omeprazole (PRILOSEC) 20 MG capsule Take 20 mg by mouth daily.  07/13/19  [provider]  ARIPiprazole (ABILIFY) 5 MG tablet Take by mouth. 07/12/19 08/10/19  [provider]  atorvastatin (LIPITOR) 20 MG tablet Take by mouth. 03/06/19   [provider]  buPROPion (WELLBUTRIN SR) 150 MG 12 hr tablet Take by mouth. 07/12/19 08/10/19  [provider]  calcium carbonate (OS-CAL) 1250 (500 Ca) MG chewable tablet Chew by mouth.    [provider]  clonazePAM (KLONOPIN) 0.5 MG tablet Take 0.5 mg by mouth at bedtime. Take every day per patient    [provider]  gabapentin (NEURONTIN) 400 MG capsule Take by mouth. 07/12/19 08/10/19  [provider]  hydrOXYzine (ATARAX/VISTARIL) 25 MG tablet Take 1-2 tablets (25-50 mg total) by mouth every 4 (  four) hours as needed. 07/13/19   Raylene Everts, MD  lisinopril (ZESTRIL) 30 MG tablet Take 30 mg by mouth daily.    [provider]  loratadine (CLARITIN) 10 MG tablet Take 10 mg by mouth daily.    [provider]  NARCAN 4 MG/0.1ML LIQD nasal spray kit USE AS DIRECTED BY PROVIDER 04/30/19   [provider]  pantoprazole (PROTONIX) 40 MG tablet Take 40 mg by mouth daily.    [provider]  permethrin (ELIMITE) 5 % cream Apply to affected area once.  Repeat in one week if needed 07/13/19   Raylene Everts, MD  prazosin (MINIPRESS) 1 MG capsule Take 1 mg by mouth at bedtime. 04/10/19   [provider]  QUEtiapine (SEROQUEL) 100 MG tablet Take by mouth. 07/12/19 08/10/19  [provider]  tiZANidine (ZANAFLEX) 4 MG tablet Take  1-2 tablets (4-8 mg total) by mouth every 6 (six) hours as needed for muscle spasms. 07/13/19   Raylene Everts, MD  zolpidem (AMBIEN) 10 MG tablet Take 10 mg by mouth at bedtime as needed for sleep.  07/13/19  [provider]    Allergies    Iodinated diagnostic agents, Shellfish allergy, Vancomycin, Nsaids, Bee venom, Ondansetron, Penicillins, and Metoclopramide  Review of Systems   Review of Systems  All other systems reviewed and are negative.   Physical Exam Updated Vital Signs BP (!) 144/89 (BP Location: Right Arm)   Pulse (!) 103   Temp 98.5 F (36.9 C)   Resp 17   Ht 6' (1.829 m)   Wt 93 kg   SpO2 99%   BMI 27.80 kg/m   Physical Exam Vitals and nursing note reviewed.  Constitutional:      Appearance: Normal appearance. He is normal weight.  HENT:     Head: Normocephalic and atraumatic.     Right Ear: External ear normal.     Left Ear: External ear normal.     Nose: Nose normal.     Mouth/Throat:     Mouth: Mucous membranes are dry.     Pharynx: No oropharyngeal exudate or posterior oropharyngeal erythema.  Eyes:     Extraocular Movements: Extraocular movements intact.     Conjunctiva/sclera: Conjunctivae normal.     Pupils: Pupils are equal, round, and reactive to light.  Cardiovascular:     Rate and Rhythm: Regular rhythm. Tachycardia present.     Pulses: Normal pulses.     Heart sounds: No murmur heard.   Pulmonary:     Effort: Pulmonary effort is normal. No respiratory distress.     Breath sounds: Normal breath sounds.  Abdominal:     Palpations: Abdomen is soft.     Tenderness: There is abdominal tenderness in the right upper quadrant and epigastric area. There is no guarding or rebound.       Comments: Patient has a well-healed upper midline surgical scar.  Musculoskeletal:        General: Normal range of motion.     Cervical back: Normal range of motion.  Skin:    General: Skin is warm and dry.     Coloration: Skin is not pale.    Neurological:     General: No focal deficit present.     Mental Status: He is alert and oriented to person, place, and time.     Cranial Nerves: No cranial nerve deficit.  Psychiatric:        Mood and Affect: Mood normal.  Behavior: Behavior normal.        Thought Content: Thought content normal.     ED Results / Procedures / Treatments   Labs (all labs ordered are listed, but only abnormal results are displayed) Results for orders placed or performed during the hospital encounter of 08/05/19  Comprehensive metabolic panel  Result Value Ref Range   Sodium 141 135 - 145 mmol/L   Potassium 3.7 3.5 - 5.1 mmol/L   Chloride 105 98 - 111 mmol/L   CO2 22 22 - 32 mmol/L   Glucose, Bld 102 (H) 70 - 99 mg/dL   BUN 20 6 - 20 mg/dL   Creatinine, Ser 1.08 0.61 - 1.24 mg/dL   Calcium 9.6 8.9 - 10.3 mg/dL   Total Protein 9.0 (H) 6.5 - 8.1 g/dL   Albumin 5.0 3.5 - 5.0 g/dL   AST 34 15 - 41 U/L   ALT 46 (H) 0 - 44 U/L   Alkaline Phosphatase 60 38 - 126 U/L   Total Bilirubin 0.7 0.3 - 1.2 mg/dL   GFR calc non Af Amer >60 >60 mL/min   GFR calc Af Amer >60 >60 mL/min   Anion gap 14 5 - 15  CBC  Result Value Ref Range   WBC 11.6 (H) 4.0 - 10.5 K/uL   RBC 5.14 4.22 - 5.81 MIL/uL   Hemoglobin 14.7 13.0 - 17.0 g/dL   HCT 43.1 39 - 52 %   MCV 83.9 80.0 - 100.0 fL   MCH 28.6 26.0 - 34.0 pg   MCHC 34.1 30.0 - 36.0 g/dL   RDW 14.3 11.5 - 15.5 %   Platelets 473 (H) 150 - 400 K/uL   nRBC 0.0 0.0 - 0.2 %  Protime-INR  Result Value Ref Range   Prothrombin Time 13.2 11.4 - 15.2 seconds   INR 1.1 0.8 - 1.2  APTT  Result Value Ref Range   aPTT 33 24 - 36 seconds  Urinalysis, Routine w reflex microscopic  Result Value Ref Range   Color, Urine YELLOW YELLOW   APPearance CLEAR CLEAR   Specific Gravity, Urine 1.019 1.005 - 1.030   pH 5.0 5.0 - 8.0   Glucose, UA NEGATIVE NEGATIVE mg/dL   Hgb urine dipstick NEGATIVE NEGATIVE   Bilirubin Urine NEGATIVE NEGATIVE   Ketones, ur NEGATIVE  NEGATIVE mg/dL   Protein, ur NEGATIVE NEGATIVE mg/dL   Nitrite NEGATIVE NEGATIVE   Leukocytes,Ua NEGATIVE NEGATIVE  Urine rapid drug screen (hosp performed)  Result Value Ref Range   Opiates NONE DETECTED NONE DETECTED   Cocaine NONE DETECTED NONE DETECTED   Benzodiazepines NONE DETECTED NONE DETECTED   Amphetamines NONE DETECTED NONE DETECTED   Tetrahydrocannabinol NONE DETECTED NONE DETECTED   Barbiturates NONE DETECTED NONE DETECTED  Type and screen [place]  Result Value Ref Range   ABO/RH(D) O POS    Antibody Screen NEG    Sample Expiration      08/07/2019,2359 Performed at [facility], [place] [address] [place], [place] [address]   ABO/Rh  Result Value Ref Range   ABO/RH(D)      O POS Performed at [facility] [address], [place], [place] [address]    Laboratory interpretation all normal except leukocytosis    EKG None  Radiology CT Abdomen Pelvis Wo Contrast  Result Date: 08/05/2019 CLINICAL DATA:  Vomiting for several days with coffee-ground emesis. Tonight with bright red bleeding. History of bleeding from stomach in past. EXAM: CT ABDOMEN  AND PELVIS WITHOUT CONTRAST TECHNIQUE: Multidetector CT imaging of the abdomen and pelvis was performed following the standard protocol without IV contrast. Patient declined enteric contrast. COMPARISON:  None. FINDINGS: Lower chest: Minor dependent atelectasis in the right greater than left lower lobe. Patulous distal esophagus. No evidence of pneumomediastinum. Hepatobiliary: Calcified granuloma in the left lobe. No evidence of focal abnormality on noncontrast exam. Gallbladder physiologically distended, no calcified stone. No biliary dilatation. Pancreas: No ductal dilatation or inflammation. Spleen: Normal in size without focal abnormality. Splenule at the hilum Adrenals/Urinary Tract: Normal adrenal glands. No hydronephrosis. No perinephric edema. No renal calculi.  Both ureters are decompressed without stones along the course. Bladder partially distended, no bladder wall thickening or stone. Stomach/Bowel: Patulous distal esophagus. No definite distal esophageal wall thickening. The unenhanced stomach is unremarkable. Normal positioning of the duodenum and ligament of Treitz. There is no small bowel obstruction or inflammatory change. Prior appendectomy with surgical clips at the base of the cecum. Enteric chain sutures in the sigmoid which is slightly redundant. Moderate stool in the proximal colon, small volume of stool distally. No colonic wall thickening or inflammation. Vascular/Lymphatic: The abdominal aorta is normal in caliber. Prominent periportal nodes are likely reactive. Reproductive: Prostate is unremarkable. Other: No free air, free fluid, or intra-abdominal fluid collection. Postsurgical change of the anterior abdominal wall. Small supraumbilical fat containing abdominal wall hernia. Musculoskeletal: There are no acute or suspicious osseous abnormalities. Transitional lumbosacral anatomy. IMPRESSION: 1. No acute abnormality or explanation for hematemesis. 2. Patulous distal esophagus, nonspecific, can be seen with reflux. 3. Small fat containing supraumbilical abdominal wall hernia. Electronically Signed   By: Keith Rake M.D.   On: 08/05/2019 02:43   DG Chest Port 1 View  Result Date: 08/05/2019 CLINICAL DATA:  Initial evaluation for hematemesis, vomiting blood. EXAM: PORTABLE CHEST 1 VIEW COMPARISON:  Prior radiograph from 06/08/2014. FINDINGS: The cardiac and mediastinal silhouettes are stable in size and contour, and remain within normal limits. The lungs are normally inflated. No airspace consolidation, pleural effusion, or pulmonary edema. No pneumothorax. No acute osseous abnormality. IMPRESSION: No active cardiopulmonary disease. Electronically Signed   By: Jeannine Boga M.D.   On: 08/05/2019 01:08    Procedures .Critical  Care Performed by: Rolland Porter, MD Authorized by: Rolland Porter, MD   Critical care provider statement:    Critical care time (minutes):  39   Critical care was necessary to treat or prevent imminent or life-threatening deterioration of the following conditions:  Circulatory failure   Critical care was time spent personally by me on the following activities:  Discussions with consultants, examination of patient, obtaining history from patient or surrogate, ordering and review of laboratory studies, ordering and review of radiographic studies, pulse oximetry, re-evaluation of patient's condition and review of old charts   (including critical care time)  August 23, 2017 [facility] Upon admission, patient was started on twice daily IV Protonix and hemoglobin was trended. Patient underwent EGD on 08/23/2016, which showed erosive gastritis without evidence of active bleeding and no other acute findings. Gastric biopsies were obtained, but had not resulted by time of patient's discharge. Patient's hemoglobin remained stable during hospitalization, and he did not experience recurrence of hematemesis or melanotic stool. On day of discharge, patient was tolerating solid diet without issue, his serum H. pylori antibody was still pending and his hemoglobin was 11.7. Recommend outpatient taper of PPI as directed below, repeat CBC in 1 week, and avoidance of all NSAIDs.   Medications Ordered in ED  Medications  pantoprazole (PROTONIX) 80 mg in sodium chloride 0.9 % 100 mL (0.8 mg/mL) infusion (8 mg/hr Intravenous New Bag/Given 08/05/19 0159)  pantoprazole (PROTONIX) injection 40 mg (has no administration in time range)  diphenhydrAMINE (BENADRYL) injection 25 mg (has no administration in time range)  sodium chloride 0.9 % bolus 1,000 mL (0 mLs Intravenous Stopped 08/05/19 0200)  promethazine (PHENERGAN) injection 25 mg (25 mg Intramuscular Given 08/05/19 0048)  pantoprazole (PROTONIX) 80 mg in sodium chloride 0.9 % 100 mL IVPB  (0 mg Intravenous Stopped 08/05/19 0200)  fentaNYL (SUBLIMAZE) injection 50 mcg (50 mcg Intravenous Given 08/05/19 0214)  sodium chloride 0.9 % bolus 1,000 mL (0 mLs Intravenous Stopped 08/05/19 0412)  promethazine (PHENERGAN) injection 25 mg (25 mg Intramuscular Given 08/05/19 0415)  fentaNYL (SUBLIMAZE) injection 50 mcg (50 mcg Intravenous Given 08/05/19 7408)    ED Course  I have reviewed the triage vital signs and the nursing notes.  Pertinent labs & imaging results that were available during my care of the patient were reviewed by me and considered in my medical decision making (see chart for details).    MDM Rules/Calculators/A&P                          Nurses report patient vomited bright red blood when he was being triaged.  Patient was given IV fluids, he was started on a Protonix bolus and drip.  Chest x-ray was done to look for signs of esophageal rupture although he has no chest pain or pain on swallowing.  Recheck at 1:15 AM patient was informed his CBC shows he is not anemic.  He has significant other at bedside.  I think I will order a CT of his abdomen since he has been fairly stable.  3:45 AM patient informed of his CT results.  He states his pain and nausea are starting to come back.  His fentanyl and Phenergan were repeated.  I am waiting for gastroenterology to call me back so I can have the hospitalist admit.  I have been reviewing patient's prior records from [facility], he has a history of heroin abuse and fentanyl abuse.  In 2019 he had endoscopy done which showed erosive gastritis without active bleeding although he presented with hematemesis.  He also had hematemesis in January but I cannot tell the endoscopy results although they discussed that he was going to have it done.  Patient is very specific about which medications he can and cannot take.  GI consult was placed at 3:20 AM, at 4:40 AM they have not responded.  I will talk to the hospitalist about  admission since he has been very stable.  5:55 AM Dr. Collene Mares, gastroenterology given information about patient.  She wants him kept n.p.o. and she will do endoscopy later this morning.  Final Clinical Impression(s) / ED Diagnoses Final diagnoses:  Upper GI bleeding    Rx / DC Orders  Plan admission  Rolland Porter, MD, Barbette Or, MD 08/05/19 [phone]

## 2019-08-05 NOTE — Consult Note (Signed)
UNASSIGNED PATIENT CROSS COVER [facility] Reason for Consult: Vomiting blood. Referring Physician: Rita Ohara, MD Walter Kemp is an 29 y.o. male.  HPI: Walter Kemp is a 29 year old white male, a veteran of the [place] war who presented to the emergency room at [facility] with a history of hematemesis and right upper quadrant pain.  Apparently has had symptoms like this on multiple occasions for which she has been seen at [facility].  He claims his symptoms started 3 days ago when he started developing abdominal pain with nausea and vomiting which later changed to hematemesis. He denies having any syncope or near syncopal events.  He denies a previous history of ulcer disease but is on Protonix on an outpatient basis for acid reflux.  There is no history of dysphagia or odynophagia.  He suffers from posttraumatic stress stress disorder, severe anxiety due to his medical and emotional issues complicated by bipolar disorder with severe depression.  He has been injured in [place] and has had surgery on his abdomen for shrapnel that hit his abdomen.  Unfortunately has a history of polysubstance abuse and has been diagnosed with pancreatitis in the past.   Past Medical History:  Diagnosis Date  . Bipolar affective disorder ([place])   . Depression   . Hypertension   . Pancreatitis   . Seizures ([place])   .     Past Surgical History:  Procedure Laterality Date  . ABDOMINAL SURGERY    . APPENDECTOMY    . COLON SURGERY    . HAND SURGERY    . LAPAROTOMY    . SHOULDER SURGERY     History reviewed. No pertinent family history.  Social History:  reports that he has quit smoking. His smoking use included cigarettes. He smoked 0.50 packs per day. He has never used smokeless tobacco. He reports that he does not drink alcohol. No history on file for drug use.  Allergies:  Allergies  Allergen Reactions  . Iodinated Diagnostic Agents Hives, Itching and Rash  . Shellfish Allergy  Anaphylaxis  . Vancomycin Anaphylaxis and Hives    Throat Swelling per patient   . Nsaids Nausea And Vomiting and Other (See Comments)    GI distress   . Bee Venom Swelling  . Ondansetron     unknown  . Penicillins     unknown  . Metoclopramide Diarrhea   Medications: I have reviewed the patient's current medications.  Results for orders placed or performed during the hospital encounter of 08/05/19 (from the past 48 hour(s))  Comprehensive metabolic panel     Status: Abnormal   Collection Time: 08/04/19 11:26 PM  Result Value Ref Range   Sodium 141 135 - 145 mmol/L   Potassium 3.7 3.5 - 5.1 mmol/L   Chloride 105 98 - 111 mmol/L   CO2 22 22 - 32 mmol/L   Glucose, Bld 102 (H) 70 - 99 mg/dL    Comment: Glucose reference range applies only to samples taken after fasting for at least 8 hours.   BUN 20 6 - 20 mg/dL   Creatinine, Ser 1.08 0.61 - 1.24 mg/dL   Calcium 9.6 8.9 - 10.3 mg/dL   Total Protein 9.0 (H) 6.5 - 8.1 g/dL   Albumin 5.0 3.5 - 5.0 g/dL   AST 34 15 - 41 U/L   ALT 46 (H) 0 - 44 U/L   Alkaline Phosphatase 60 38 - 126 U/L   Total Bilirubin 0.7 0.3 - 1.2 mg/dL   GFR calc  non Af Amer >60 >60 mL/min   GFR calc Af Amer >60 >60 mL/min   Anion gap 14 5 - 15    Comment: Performed at [facility], [place] [address], [place], [place] [address]  CBC     Status: Abnormal   Collection Time: 08/04/19 11:26 PM  Result Value Ref Range   WBC 11.6 (H) 4.0 - 10.5 K/uL   RBC 5.14 4.22 - 5.81 MIL/uL   Hemoglobin 14.7 13.0 - 17.0 g/dL   HCT 43.1 39 - 52 %   MCV 83.9 80.0 - 100.0 fL   MCH 28.6 26.0 - 34.0 pg   MCHC 34.1 30.0 - 36.0 g/dL   RDW 14.3 11.5 - 15.5 %   Platelets 473 (H) 150 - 400 K/uL   nRBC 0.0 0.0 - 0.2 %    Comment: Performed at [facility], [place] [address] [place], [place] [address]  Type and screen [place]     Status: None   Collection Time: 08/04/19 11:26 PM  Result Value Ref Range   ABO/RH(D) O POS     Antibody Screen NEG    Sample Expiration      08/07/2019,2359 Performed at [facility], [place] [address], [place], [place] [address]   ABO/Rh     Status: None   Collection Time: 08/04/19 11:26 PM  Result Value Ref Range   ABO/RH(D)      O POS Performed at [facility], [place] [address], [place], [place] [address]   Protime-INR     Status: None   Collection Time: 08/05/19 12:47 AM  Result Value Ref Range   Prothrombin Time 13.2 11.4 - 15.2 seconds   INR 1.1 0.8 - 1.2    Comment: (NOTE) INR goal varies based on device and disease states. Performed at [facility], [place] [address], [place], [place] [address]   APTT     Status: None   Collection Time: 08/05/19 12:47 AM  Result Value Ref Range   aPTT 33 24 - 36 seconds    Comment: Performed at [facility], [place] [address], [place], [place] [address]  Urinalysis, Routine w reflex microscopic     Status: None   Collection Time: 08/05/19 12:50 AM  Result Value Ref Range   Color, Urine YELLOW YELLOW   APPearance CLEAR CLEAR   Specific Gravity, Urine 1.019 1.005 - 1.030   pH 5.0 5.0 - 8.0   Glucose, UA NEGATIVE NEGATIVE mg/dL   Hgb urine dipstick NEGATIVE NEGATIVE   Bilirubin Urine NEGATIVE NEGATIVE   Ketones, ur NEGATIVE NEGATIVE mg/dL   Protein, ur NEGATIVE NEGATIVE mg/dL   Nitrite NEGATIVE NEGATIVE   Leukocytes,Ua NEGATIVE NEGATIVE    Comment: Performed at [facility], [place] [address], [place], [place] [address]  Urine rapid drug screen (hosp performed)     Status: None   Collection Time: 08/05/19 12:50 AM  Result Value Ref Range   Opiates NONE DETECTED NONE DETECTED   Cocaine NONE DETECTED NONE DETECTED   Benzodiazepines NONE DETECTED NONE DETECTED   Amphetamines NONE DETECTED NONE DETECTED   Tetrahydrocannabinol NONE DETECTED NONE DETECTED   Barbiturates NONE DETECTED NONE DETECTED    Comment: (NOTE) DRUG SCREEN FOR MEDICAL  PURPOSES ONLY.  IF CONFIRMATION IS NEEDED FOR ANY PURPOSE, NOTIFY LAB WITHIN 5 DAYS.  LOWEST DETECTABLE LIMITS FOR URINE DRUG SCREEN Drug Class  Cutoff (ng/mL) Amphetamine and metabolites    1000 Barbiturate and metabolites    200 Benzodiazepine                 628 Tricyclics and metabolites     300 Opiates and metabolites        300 Cocaine and metabolites        300 THC                            50 Performed at [facility], [place] [address] [place], [place] [address]   POC occult blood, ED RN will collect     Status: Abnormal   Collection Time: 08/05/19  5:10 AM  Result Value Ref Range   Fecal Occult Bld POSITIVE (A) NEGATIVE  SARS Coronavirus 2 by RT PCR (hospital order, performed in [facility] hospital lab) Nasopharyngeal Nasopharyngeal Swab     Status: None   Collection Time: 08/05/19  5:10 AM   Specimen: Nasopharyngeal Swab  Result Value Ref Range   SARS Coronavirus 2 NEGATIVE NEGATIVE    Comment: (NOTE) SARS-CoV-2 target nucleic acids are NOT DETECTED.  The SARS-CoV-2 RNA is generally detectable in upper and lower respiratory specimens during the acute phase of infection. The lowest concentration of SARS-CoV-2 viral copies this assay can detect is 250 copies / mL. A negative result does not preclude SARS-CoV-2 infection and should not be used as the sole basis for treatment or other patient management decisions.  A negative result may occur with improper specimen collection / handling, submission of specimen other than nasopharyngeal swab, presence of viral mutation(s) within the areas targeted by this assay, and inadequate number of viral copies (<250 copies / mL). A negative result must be combined with clinical observations, patient history, and epidemiological information.  Fact Sheet for Patients:   StrictlyIdeas.no  Fact Sheet for Healthcare  Providers: BankingDealers.co.za  This test is not yet approved or  cleared by the [place] FDA and has been authorized for detection and/or diagnosis of SARS-CoV-2 by FDA under an Emergency Use Authorization (EUA).  This EUA will remain in effect (meaning this test can be used) for the duration of the COVID-19 declaration under Section 564(b)(1) of the Act, 21 U.S.C. section 360bbb-3(b)(1), unless the authorization is terminated or revoked sooner.  Performed at [facility], [place] [address] [place], [place] [address]     CT Abdomen Pelvis Wo Contrast  Result Date: 08/05/2019 CLINICAL DATA:  Vomiting for several days with coffee-ground emesis. Tonight with bright red bleeding. History of bleeding from stomach in past. EXAM: CT ABDOMEN AND PELVIS WITHOUT CONTRAST TECHNIQUE: Multidetector CT imaging of the abdomen and pelvis was performed following the standard protocol without IV contrast. Patient declined enteric contrast. COMPARISON:  None. FINDINGS: Lower chest: Minor dependent atelectasis in the right greater than left lower lobe. Patulous distal esophagus. No evidence of pneumomediastinum. Hepatobiliary: Calcified granuloma in the left lobe. No evidence of focal abnormality on noncontrast exam. Gallbladder physiologically distended, no calcified stone. No biliary dilatation. Pancreas: No ductal dilatation or inflammation. Spleen: Normal in size without focal abnormality. Splenule at the hilum Adrenals/Urinary Tract: Normal adrenal glands. No hydronephrosis. No perinephric edema. No renal calculi. Both ureters are decompressed without stones along the course. Bladder partially distended, no bladder wall thickening or stone. Stomach/Bowel: Patulous distal esophagus. No definite distal esophageal wall thickening. The unenhanced stomach is unremarkable. Normal positioning of the duodenum and ligament of  Treitz. There is no small bowel obstruction or  inflammatory change. Prior appendectomy with surgical clips at the base of the cecum. Enteric chain sutures in the sigmoid which is slightly redundant. Moderate stool in the proximal colon, small volume of stool distally. No colonic wall thickening or inflammation. Vascular/Lymphatic: The abdominal aorta is normal in caliber. Prominent periportal nodes are likely reactive. Reproductive: Prostate is unremarkable. Other: No free air, free fluid, or intra-abdominal fluid collection. Postsurgical change of the anterior abdominal wall. Small supraumbilical fat containing abdominal wall hernia. Musculoskeletal: There are no acute or suspicious osseous abnormalities. Transitional lumbosacral anatomy. IMPRESSION: 1. No acute abnormality or explanation for hematemesis. 2. Patulous distal esophagus, nonspecific, can be seen with reflux. 3. Small fat containing supraumbilical abdominal wall hernia. Electronically Signed   By: Keith Rake M.D.   On: 08/05/2019 02:43   DG Chest Port 1 View  Result Date: 08/05/2019 CLINICAL DATA:  Initial evaluation for hematemesis, vomiting blood. EXAM: PORTABLE CHEST 1 VIEW COMPARISON:  Prior radiograph from 06/08/2014. FINDINGS: The cardiac and mediastinal silhouettes are stable in size and contour, and remain within normal limits. The lungs are normally inflated. No airspace consolidation, pleural effusion, or pulmonary edema. No pneumothorax. No acute osseous abnormality. IMPRESSION: No active cardiopulmonary disease. Electronically Signed   By: Jeannine Boga M.D.   On: 08/05/2019 01:08   ROS:  As stated above in the HPI otherwise negative.  Blood pressure (!) 132/97, pulse 83, temperature 98.5 F (36.9 C), resp. rate 18, height 6' (1.829 m), weight 93 kg, SpO2 98 %.  PE: Very anxious but cooperative young white male in no acute distress, multiple tattoos Gen: Alert and Oriented x 3 HEENT:  [place]/AT, EOMI Neck: Supple, no LAD Lungs: CTA Bilaterally CV: RRR without  M/G/R ABM: Soft with significant right upper quadrant tenderness on palpation with guarding but no rebound or rigidity, patient has normal bowel sounds, tubal scars noted on the abdomen from above-mentioned surgeries Ext: No C/C/E  Assessment/Plan: 1) Severe nausea vomiting/hematemesis/GERD-proceed with a EGD at this time. 2) Right upper quadrant pain-we will need to rule out gallbladder disease and therefore right upper quadrant ultrasound and HIDA scan with CCK injection has been advised. Apparently the patient has been diagnosed with fatty liver in the past.  A low-fat diet has been advocated 3) Polysubstance abuse/history of pancreatitis. Juanita Craver 08/05/2019, 10:20 AM

## 2019-08-05 NOTE — H&P (Signed)
History and Physical    Walter Kemp KZL:[id number] DOB: 1990/04/28 DOA: 08/05/2019  PCP: Patient, No Pcp Per   Patient coming from: Home   Chief Complaint: Abdominal pain, bloody vomit   HPI: Walter Kemp is a 29 y.o. male with medical history significant for hypertension, bipolar disorder, PTSD, chronic hepatitis C, history of polysubstance abuse, presenting to the emergency department for evaluation of abdominal pain and hematemesis.  Patient reports that he began vomiting approximately 3 days ago, initially nonbloody, but went on to develop some pain in the upper abdomen and coffee-ground emesis.  Vomiting persisted and became bright red, prompting the patient to present to the ED.  He denies any fevers, chills, diarrhea, cough, or shortness of breath.  He denies any alcohol use.  Denies any recent illicit substance use.  ED Course: Upon arrival to the ED, patient is found to be afebrile, saturating well on room air, tachycardic, and with blood pressure 144/89.  Chest x-ray is negative for acute cardiopulmonary disease and CT of the abdomen/pelvis is negative for acute intra-abdominal or pelvic abnormality but notable for nonspecific patulous distal esophagus.  Chemistry panel features a normal BUN, ALT slightly elevated, and total protein of 9.0.  CBC with normal hemoglobin along with mild leukocytosis and thrombocytosis.  COVID-19 screening test not yet resulted.  Patient was given 2 L of normal saline, started on IV Protonix, given fentanyl, and Phenergan.  Gastroenterology was consulted by the ED physician and hospitalist asked to admit.  Review of Systems:  All other systems reviewed and apart from HPI, are negative.  Past Medical History:  Diagnosis Date  . Bipolar affective disorder ([place])   . Depression   . Hypertension   . Pancreatitis   . Seizures ([place])   . Stomach ulcer     Past Surgical History:  Procedure Laterality Date  . ABDOMINAL SURGERY    . APPENDECTOMY      . COLON SURGERY    . HAND SURGERY    . LAPAROTOMY    . SHOULDER SURGERY       reports that he has quit smoking. His smoking use included cigarettes. He smoked 0.50 packs per day. He has never used smokeless tobacco. He reports that he does not drink alcohol. No history on file for drug use.  Allergies  Allergen Reactions  . Iodinated Diagnostic Agents Hives, Itching and Rash  . Shellfish Allergy Anaphylaxis  . Vancomycin Anaphylaxis and Hives    Throat Swelling per patient   . Nsaids Nausea And Vomiting and Other (See Comments)    GI distress   . Bee Venom Swelling  . Ondansetron     unknown  . Penicillins     unknown  . Metoclopramide Diarrhea    History reviewed. No pertinent family history.   Prior to Admission medications   Medication Sig Start Date End Date Taking? Authorizing Provider  omeprazole (PRILOSEC) 20 MG capsule Take 20 mg by mouth daily.  07/13/19  [provider]  ARIPiprazole (ABILIFY) 5 MG tablet Take by mouth. 07/12/19 08/10/19  [provider]  atorvastatin (LIPITOR) 20 MG tablet Take by mouth. 03/06/19   [provider]  buPROPion (WELLBUTRIN SR) 150 MG 12 hr tablet Take by mouth. 07/12/19 08/10/19  [provider]  calcium carbonate (OS-CAL) 1250 (500 Ca) MG chewable tablet Chew by mouth.    [provider]  clonazePAM (KLONOPIN) 0.5 MG tablet Take 0.5 mg by mouth at bedtime. Take every day per patient  [provider]  gabapentin (NEURONTIN) 400 MG capsule Take by mouth. 07/12/19 08/10/19  [provider]  hydrOXYzine (ATARAX/VISTARIL) 25 MG tablet Take 1-2 tablets (25-50 mg total) by mouth every 4 (four) hours as needed. 07/13/19   Raylene [place], MD  lisinopril (ZESTRIL) 30 MG tablet Take 30 mg by mouth daily.    [provider]  loratadine (CLARITIN) 10 MG tablet Take 10 mg by mouth daily.    [provider]  NARCAN 4 MG/0.1ML LIQD nasal spray kit USE AS DIRECTED BY  PROVIDER 04/30/19   [provider]  pantoprazole (PROTONIX) 40 MG tablet Take 40 mg by mouth daily.    [provider]  permethrin (ELIMITE) 5 % cream Apply to affected area once.  Repeat in one week if needed 07/13/19   Raylene [place], MD  prazosin (MINIPRESS) 1 MG capsule Take 1 mg by mouth at bedtime. 04/10/19   [provider]  QUEtiapine (SEROQUEL) 100 MG tablet Take by mouth. 07/12/19 08/10/19  [provider]  tiZANidine (ZANAFLEX) 4 MG tablet Take 1-2 tablets (4-8 mg total) by mouth every 6 (six) hours as needed for muscle spasms. 07/13/19   Raylene [place]ts, MD  zolpidem (AMBIEN) 10 MG tablet Take 10 mg by mouth at bedtime as needed for sleep.  07/13/19  [provider]    Physical Exam: Vitals:   08-26-19 2308 08/05/19 0216 08/05/19 0400 08/05/19 0411  BP:  (!) 161/112 (!) 144/84 (!) 144/89  Pulse:  (!) 107 95 (!) 103  Resp:  12  17  Temp:  98.5 F (36.9 C)    TempSrc:      SpO2:  100% 100% 99%  Weight: 93 kg     Height: 6' (1.829 m)       Constitutional: NAD, calm  Eyes: PERTLA, lids and conjunctivae normal ENMT: Mucous membranes are moist. Posterior pharynx clear of any exudate or lesions.   Neck: normal, supple, no masses, no thyromegaly Respiratory:  no wheezing, no crackles. No accessory muscle use.  Cardiovascular: S1 & S2 heard, regular rate and rhythm. No extremity edema.   Abdomen: No distension, soft, tender in epigastrium without rebound pain or guarding. Bowel sounds active.  Musculoskeletal: no clubbing / cyanosis. No joint deformity upper and lower extremities.   Skin: no significant rashes, lesions, ulcers. Warm, dry, well-perfused. Neurologic: No facial asymmetry. Sensation intact. Moving all extremities.  Psychiatric: Alert and oriented to person, place, and situation. Calm and cooperative.    Labs and Imaging on Admission: I have personally reviewed following labs and imaging studies  CBC: Recent Labs    Lab 26-Aug-2019 2326  WBC 11.6*  HGB 14.7  HCT 43.1  MCV 83.9  PLT 892*   Basic Metabolic Panel: Recent Labs  Lab 08-26-19 2326  NA 141  K 3.7  CL 105  CO2 22  GLUCOSE 102*  BUN 20  CREATININE 1.08  CALCIUM 9.6   GFR: Estimated Creatinine Clearance: 108.8 mL/min (by C-G formula based on SCr of 1.08 mg/dL). Liver Function Tests: Recent Labs  Lab 26-Aug-2019 2326  AST 34  ALT 46*  ALKPHOS 60  BILITOT 0.7  PROT 9.0*  ALBUMIN 5.0   No results for input(s): LIPASE, AMYLASE in the last 168 hours. No results for input(s): AMMONIA in the last 168 hours. Coagulation Profile: Recent Labs  Lab 08/05/19 0047  INR 1.1   Cardiac Enzymes: No results for input(s): CKTOTAL, CKMB, CKMBINDEX, TROPONINI in the last 168 hours. BNP (  last 3 results) No results for input(s): PROBNP in the last 8760 hours. HbA1C: No results for input(s): HGBA1C in the last 72 hours. CBG: No results for input(s): GLUCAP in the last 168 hours. Lipid Profile: No results for input(s): CHOL, HDL, LDLCALC, TRIG, CHOLHDL, LDLDIRECT in the last 72 hours. Thyroid Function Tests: No results for input(s): TSH, T4TOTAL, FREET4, T3FREE, THYROIDAB in the last 72 hours. Anemia Panel: No results for input(s): VITAMINB12, FOLATE, FERRITIN, TIBC, IRON, RETICCTPCT in the last 72 hours. Urine analysis:    Component Value Date/Time   COLORURINE YELLOW 08/05/2019 0050   APPEARANCEUR CLEAR 08/05/2019 0050   LABSPEC 1.019 08/05/2019 0050   PHURINE 5.0 08/05/2019 0050   GLUCOSEU NEGATIVE 08/05/2019 0050   HGBUR NEGATIVE 08/05/2019 0050   BILIRUBINUR NEGATIVE 08/05/2019 0050   KETONESUR NEGATIVE 08/05/2019 0050   PROTEINUR NEGATIVE 08/05/2019 0050   UROBILINOGEN 0.2 06/08/2014 1833   NITRITE NEGATIVE 08/05/2019 0050   LEUKOCYTESUR NEGATIVE 08/05/2019 0050   Sepsis Labs: _0 (procalcitonin:4,lacticidven:4) )No results found for this or any previous visit (from the past 240 hour(s)).   Radiological Exams on  Admission: CT Abdomen Pelvis Wo Contrast  Result Date: 08/05/2019 CLINICAL DATA:  Vomiting for several days with coffee-ground emesis. Tonight with bright red bleeding. History of bleeding from stomach in past. EXAM: CT ABDOMEN AND PELVIS WITHOUT CONTRAST TECHNIQUE: Multidetector CT imaging of the abdomen and pelvis was performed following the standard protocol without IV contrast. Patient declined enteric contrast. COMPARISON:  None. FINDINGS: Lower chest: Minor dependent atelectasis in the right greater than left lower lobe. Patulous distal esophagus. No evidence of pneumomediastinum. Hepatobiliary: Calcified granuloma in the left lobe. No evidence of focal abnormality on noncontrast exam. Gallbladder physiologically distended, no calcified stone. No biliary dilatation. Pancreas: No ductal dilatation or inflammation. Spleen: Normal in size without focal abnormality. Splenule at the hilum Adrenals/Urinary Tract: Normal adrenal glands. No hydronephrosis. No perinephric edema. No renal calculi. Both ureters are decompressed without stones along the course. Bladder partially distended, no bladder wall thickening or stone. Stomach/Bowel: Patulous distal esophagus. No definite distal esophageal wall thickening. The unenhanced stomach is unremarkable. Normal positioning of the duodenum and ligament of Treitz. There is no small bowel obstruction or inflammatory change. Prior appendectomy with surgical clips at the base of the cecum. Enteric chain sutures in the sigmoid which is slightly redundant. Moderate stool in the proximal colon, small volume of stool distally. No colonic wall thickening or inflammation. Vascular/Lymphatic: The abdominal aorta is normal in caliber. Prominent periportal nodes are likely reactive. Reproductive: Prostate is unremarkable. Other: No free air, free fluid, or intra-abdominal fluid collection. Postsurgical change of the anterior abdominal wall. Small supraumbilical fat containing  abdominal wall hernia. Musculoskeletal: There are no acute or suspicious osseous abnormalities. Transitional lumbosacral anatomy. IMPRESSION: 1. No acute abnormality or explanation for hematemesis. 2. Patulous distal esophagus, nonspecific, can be seen with reflux. 3. Small fat containing supraumbilical abdominal wall hernia. Electronically Signed   By: Keith Rake M.D.   On: 08/05/2019 02:43   DG Chest Port 1 View  Result Date: 08/05/2019 CLINICAL DATA:  Initial evaluation for hematemesis, vomiting blood. EXAM: PORTABLE CHEST 1 VIEW COMPARISON:  Prior radiograph from 06/08/2014. FINDINGS: The cardiac and mediastinal silhouettes are stable in size and contour, and remain within normal limits. The lungs are normally inflated. No airspace consolidation, pleural effusion, or pulmonary edema. No pneumothorax. No acute osseous abnormality. IMPRESSION: No active cardiopulmonary disease. Electronically Signed   By: Jeannine Boga M.D.   On: 08/05/2019  01:08    Assessment/Plan   1. Hematemesis  - Presents with abdominal pain and hematemesis after 3 days of non-bloody vomiting, has hx of gastritis on EGD in July 2019 with chronic gastritis and negative H pylori on path, is hemodynamically stable in ED with initial Hgb normal  - He was given IVF boluses and started on IV PPI in ED  - GI is consulting and much appreciated  - Keep NPO, continue IV PPI, trend H&H    2. Bipolar disorder   - Calm and cooperative in ED  - Pharmacy medication-reconciliation pending    3. Hypertension  - With concern for acute UGIB and potential for decompensation, will be treated as-needed only   4. Hx of polysubstance abuse  - UDS negative in ED     DVT prophylaxis: SCDs Code Status: Full  Family Communication: Discussed with patient  Disposition Plan:  Patient is from: Home  Anticipated d/c is to: Home  Anticipated d/c date is: 08/06/19 Patient currently: pending GI consultation  Consults called: GI  consulted by ED physician  Admission status: Observation     Vianne Bulls, MD [facility] Pager: See [email or web address]  If 7AM-7PM, please contact the daytime attending [email or web address]  08/05/2019, 5:14 AM

## 2019-08-05 NOTE — ED Notes (Signed)
Spoke with endo and gave report. Patient will being coming back to ED room after endo.

## 2019-08-05 NOTE — Anesthesia Preprocedure Evaluation (Signed)
Anesthesia Evaluation  Patient identified by MRN, date of birth, ID band Patient awake    Reviewed: Allergy & Precautions, NPO status , Patient's Chart, lab work & pertinent test results  Airway Mallampati: I  TM Distance: >3 FB Neck ROM: Full    Dental   Pulmonary former smoker,    Pulmonary exam normal        Cardiovascular hypertension, Pt. on medications Normal cardiovascular exam     Neuro/Psych Depression Bipolar Disorder H/OPTSD   GI/Hepatic PUD,   Endo/Other    Renal/GU      Musculoskeletal   Abdominal   Peds  Hematology   Anesthesia Other Findings   Reproductive/Obstetrics                             Anesthesia Physical Anesthesia Plan  ASA: III  Anesthesia Plan: General   Post-op Pain Management:    Induction: Intravenous  PONV Risk Score and Plan: 2 and Ondansetron  Airway Management Planned: Oral ETT  Additional Equipment:   Intra-op Plan:   Post-operative Plan: Extubation in OR  Informed Consent: I have reviewed the patients History and Physical, chart, labs and discussed the procedure including the risks, benefits and alternatives for the proposed anesthesia with the patient or authorized representative who has indicated his/her understanding and acceptance.       Plan Discussed with: CRNA and Surgeon  Anesthesia Plan Comments:         Anesthesia Quick Evaluation

## 2019-08-05 NOTE — Progress Notes (Signed)
Pt has arrived to Room 1538 from Endoscopy.

## 2019-08-05 NOTE — Anesthesia Procedure Notes (Signed)
Procedure Name: Intubation Date/Time: 08/05/2019 12:37 PM Performed by: Anne Fu, CRNA Pre-anesthesia Checklist: Patient identified, Emergency Drugs available, Suction available, Patient being monitored and Timeout performed Patient Re-evaluated:Patient Re-evaluated prior to induction Oxygen Delivery Method: Circle system utilized Preoxygenation: Pre-oxygenation with 100% oxygen Induction Type: IV induction Ventilation: Mask ventilation without difficulty Laryngoscope Size: Mac and 4 Tube type: Oral Tube size: 7.5 mm Number of attempts: 1 Airway Equipment and Method: Stylet Placement Confirmation: ETT inserted through vocal cords under direct vision,  positive ETCO2 and breath sounds checked- equal and bilateral Secured at: 23 cm Tube secured with: Tape Dental Injury: Teeth and Oropharynx as per pre-operative assessment

## 2019-08-05 NOTE — Op Note (Addendum)
[facility] Patient Name: Walter Kemp Procedure Date: 08/05/2019 MRN: [id number] Attending MD: Juanita Craver , MD Date of Birth: 1990-07-20 CSN: [id number] Age: 29 Admit Type: Outpatient Procedure:                Diagnostic EGD. Indications:              Hematemesis. Providers:                Juanita Craver, MD, [name] Lundborg, RN, Theodora Blow,                            Technician, Anne Fu CRNA. Referring MD:             Rita Ohara, MD Medicines:                General Anesthesia. Complications:            No immediate complications. Estimated Blood Loss:     Estimated blood loss: none. Procedure:                Pre-Anesthesia Assessment:- Prior to the procedure,                            a history and physical was performed, and patient                            medications and allergies were reviewed. The                            patient's tolerance of previous anesthesia was also                            reviewed. The risks and benefits of the procedure                            and the sedation options and risks were discussed                            with the patient. All questions were answered, and                            informed consent was obtained. Prior                            Anticoagulants: The patient has taken no previous                            anticoagulant or antiplatelet agents. ASA Grade                            Assessment: II - A patient with mild systemic                            disease. After reviewing the risks and benefits,  the patient was deemed in satisfactory condition to                            undergo the procedure. After obtaining informed                            consent, the endoscope was passed under direct                            vision. Throughout the procedure, the patient's                            blood pressure, pulse, and oxygen saturations were                             monitored continuously. The GIF-H190 ([id number])                            Olympus gastroscope was introduced through the                            mouth, and advanced to the second part of duodenum.                            The EGD was accomplished without difficulty. The                            patient tolerated the procedure well. Scope In: Scope Out: Findings:      There was some fresh heme in the oropharynx not sure if this is from       trauma or not.      There was LA Grade I esophagitis in the distal esophagus and the rest of       the examined esophagus appeared normal; SCJ was measured at 45 cm.      The entire examined stomach was normal.      The cardia and gastric fundus were normal on retroflexion.      The examined duodenum was normal. Impression:               - There was some fresh heme in the orpharynx                            ?trauma.                           - LA Grade I distal esophagitis; the rest of the                            esophagus appeared normal; SCJ was measured at 45                            cm.                           - Normal appearing stomach.                           -  Normal examined duodenum.                           - No specimens collected. Moderate Sedation:      GA used. Recommendation:           - Clear liquid diet today.                           - Continue present medications; avoid all NSAIDS                            for now.                           - Take Protnix 40 mg 1 PO QAM.                           - He will need a RUQ ultrasound and HIDA scan with                            CCK injection to further evaluate his RUQ pain.                           - Refer to an ENT specialist for further evaluation                            if hematemesis persists. Procedure Code(s):        --- Professional ---                           [phone], Esophagogastroduodenoscopy, flexible,                             transoral; diagnostic, including collection of                            specimen(s) by brushing or washing, when performed                            (separate procedure) Diagnosis Code(s):        --- Professional ---                           K92.0, Hematemesis                           J39.2, Other diseases of pharynx CPT copyright 2019 American [employer] Association. All rights reserved. The codes documented in this report are preliminary and upon coder review may  be revised to meet current compliance requirements. Juanita Craver, MD Juanita Craver, MD 08/05/2019 1:04:14 PM This report has been signed electronically. Number of Addenda: 0

## 2019-08-05 NOTE — Progress Notes (Signed)
Patient seen and examined and agree with plan of care 29 year old white male history of prior polysubstance abuse pancreatitis hospitalized 1 to 2 years ago [facility] comes in with hematemesis I appreciate Dr. Collene Mares see the patient scope was negative she recommends ultrasound which was ordered today-on exam he does have positive Murphy sign and I think he might have a cholecystitis? Given his prior episodes of pancreatitis, I will check an a.m. lipase-his lactic acidosis not elevated and he does not meet SIRS criteria We will need to follow along and see ultrasound and if necessary order HIDA scan-he will need at least 2 midnights I have discussed with his significant other at the bedside and they understand plan of care  Verneita Griffes, MD [facility] 3:03 PM

## 2019-08-05 NOTE — Transfer of Care (Signed)
Immediate Anesthesia Transfer of Care Note  Patient: Walter Kemp  Procedure(s) Performed: Procedure(s): ESOPHAGOGASTRODUODENOSCOPY (EGD) WITH PROPOFOL (N/A)  Patient Location: PACU  Anesthesia Type:General  Level of Consciousness:  sedated, patient cooperative and responds to stimulation  Airway & Oxygen Therapy:Patient Spontanous Breathing and Patient connected to face mask oxgen  Post-op Assessment:  Report given to PACU RN and Post -op Vital signs reviewed and stable  Post vital signs:  Reviewed and stable  Last Vitals:  Vitals:   08/05/19 0858 08/05/19 1054  BP: (!) 132/97 (!) 163/105  Pulse: 83 91  Resp: 18 (!) 21  Temp:  37.2 C  SpO2: 82% 64%    Complications: No apparent anesthesia complications

## 2019-08-05 NOTE — ED Notes (Signed)
Gi CALLED X 3

## 2019-08-05 NOTE — Anesthesia Postprocedure Evaluation (Signed)
Anesthesia Post Note  Patient: Walter Kemp  Procedure(s) Performed: ESOPHAGOGASTRODUODENOSCOPY (EGD) WITH PROPOFOL (N/A )     Patient location during evaluation: PACU Anesthesia Type: General Level of consciousness: awake and alert Pain management: pain level controlled Vital Signs Assessment: post-procedure vital signs reviewed and stable Respiratory status: spontaneous breathing, nonlabored ventilation, respiratory function stable and patient connected to nasal cannula oxygen Cardiovascular status: blood pressure returned to baseline and stable Postop Assessment: no apparent nausea or vomiting Anesthetic complications: no   No complications documented.  Last Vitals:  Vitals:   08/05/19 1320 08/05/19 1400  BP: (!) 177/108 (!) 147/96  Pulse: (!) 109 89  Resp: 20   Temp:  37.2 C  SpO2: 100% 100%    Last Pain:  Vitals:   08/05/19 1400  TempSrc: Oral  PainSc:                  Anye Brose DAVID

## 2019-08-06 ENCOUNTER — Encounter (HOSPITAL_COMMUNITY): Payer: Self-pay | Admitting: Family Medicine

## 2019-08-06 ENCOUNTER — Inpatient Hospital Stay (HOSPITAL_COMMUNITY): Payer: Self-pay

## 2019-08-06 DIAGNOSIS — K920 Hematemesis: Secondary | ICD-10-CM

## 2019-08-06 DIAGNOSIS — R1013 Epigastric pain: Secondary | ICD-10-CM

## 2019-08-06 LAB — CBC WITH DIFFERENTIAL/PLATELET
Abs Immature Granulocytes: 0.01 10*3/uL (ref 0.00–0.07)
Basophils Absolute: 0 10*3/uL (ref 0.0–0.1)
Basophils Relative: 1 %
Eosinophils Absolute: 0.3 10*3/uL (ref 0.0–0.5)
Eosinophils Relative: 5 %
HCT: 35.5 % — ABNORMAL LOW (ref 39.0–52.0)
Hemoglobin: 11.4 g/dL — ABNORMAL LOW (ref 13.0–17.0)
Immature Granulocytes: 0 %
Lymphocytes Relative: 45 %
Lymphs Abs: 2.4 10*3/uL (ref 0.7–4.0)
MCH: 27.9 pg (ref 26.0–34.0)
MCHC: 32.1 g/dL (ref 30.0–36.0)
MCV: 87 fL (ref 80.0–100.0)
Monocytes Absolute: 0.6 10*3/uL (ref 0.1–1.0)
Monocytes Relative: 10 %
Neutro Abs: 2.1 10*3/uL (ref 1.7–7.7)
Neutrophils Relative %: 39 %
Platelets: 285 10*3/uL (ref 150–400)
RBC: 4.08 MIL/uL — ABNORMAL LOW (ref 4.22–5.81)
RDW: 14.8 % (ref 11.5–15.5)
WBC: 5.4 10*3/uL (ref 4.0–10.5)
nRBC: 0 % (ref 0.0–0.2)

## 2019-08-06 LAB — COMPREHENSIVE METABOLIC PANEL
ALT: 41 U/L (ref 0–44)
AST: 38 U/L (ref 15–41)
Albumin: 3.4 g/dL — ABNORMAL LOW (ref 3.5–5.0)
Alkaline Phosphatase: 41 U/L (ref 38–126)
Anion gap: 8 (ref 5–15)
BUN: 14 mg/dL (ref 6–20)
CO2: 23 mmol/L (ref 22–32)
Calcium: 8.3 mg/dL — ABNORMAL LOW (ref 8.9–10.3)
Chloride: 108 mmol/L (ref 98–111)
Creatinine, Ser: 0.87 mg/dL (ref 0.61–1.24)
GFR calc Af Amer: >60 mL/min (ref >60)
GFR calc non Af Amer: >60 mL/min (ref >60)
Glucose, Bld: 95 mg/dL (ref 70–99)
Potassium: 3.9 mmol/L (ref 3.5–5.1)
Sodium: 139 mmol/L (ref 135–145)
Total Bilirubin: 1 mg/dL (ref 0.3–1.2)
Total Protein: 6.5 g/dL (ref 6.5–8.1)

## 2019-08-06 LAB — LIPASE, BLOOD: Lipase: 18 U/L (ref 11–51)

## 2019-08-06 LAB — HEMATOCRIT: HCT: 36.9 % — ABNORMAL LOW (ref 39.0–52.0)

## 2019-08-06 LAB — HEMOGLOBIN: Hemoglobin: 12.3 g/dL — ABNORMAL LOW (ref 13.0–17.0)

## 2019-08-06 MED ORDER — SUCRALFATE 1 GM/10ML PO SUSP
1.0000 g | Freq: Four times a day (QID) | ORAL | Status: DC | PRN
  Administered 2019-08-07: 1 g via ORAL
  Filled 2019-08-06: qty 10

## 2019-08-06 MED ORDER — PROMETHAZINE HCL 25 MG/ML IJ SOLN
12.5000 mg | Freq: Four times a day (QID) | INTRAMUSCULAR | Status: DC | PRN
  Administered 2019-08-06 – 2019-08-07 (×3): 12.5 mg via INTRAMUSCULAR
  Filled 2019-08-06 (×4): qty 1

## 2019-08-06 MED ORDER — QUETIAPINE FUMARATE 100 MG PO TABS
100.0000 mg | ORAL_TABLET | Freq: Every day | ORAL | Status: DC
  Administered 2019-08-06 – 2019-08-07 (×2): 100 mg via ORAL
  Filled 2019-08-06 (×2): qty 1

## 2019-08-06 MED ORDER — ARIPIPRAZOLE 10 MG PO TABS
5.0000 mg | ORAL_TABLET | Freq: Every day | ORAL | Status: DC
  Administered 2019-08-06 – 2019-08-07 (×2): 5 mg via ORAL
  Filled 2019-08-06 (×2): qty 1

## 2019-08-06 MED ORDER — BUPROPION HCL ER (SR) 150 MG PO TB12
150.0000 mg | ORAL_TABLET | Freq: Every day | ORAL | Status: DC
  Administered 2019-08-06 – 2019-08-07 (×2): 150 mg via ORAL
  Filled 2019-08-06 (×2): qty 1

## 2019-08-06 MED ORDER — TECHNETIUM TC 99M MEBROFENIN IV KIT
5.5000 | PACK | Freq: Once | INTRAVENOUS | Status: AC | PRN
  Administered 2019-08-06: 5.5 via INTRAVENOUS

## 2019-08-06 MED ORDER — CLONAZEPAM 0.5 MG PO TABS
0.5000 mg | ORAL_TABLET | Freq: Every day | ORAL | Status: DC
  Administered 2019-08-06: 0.5 mg via ORAL
  Filled 2019-08-06: qty 1

## 2019-08-06 MED ORDER — PANTOPRAZOLE SODIUM 40 MG PO TBEC
40.0000 mg | DELAYED_RELEASE_TABLET | Freq: Two times a day (BID) | ORAL | Status: DC
  Administered 2019-08-06 – 2019-08-07 (×3): 40 mg via ORAL
  Filled 2019-08-06 (×3): qty 1

## 2019-08-06 NOTE — Progress Notes (Signed)
Patient stated he vomited bile emesis. Event unwitnessed. Complaining of pain and nausea. Will contact provider for further interventions.
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
## 2019-08-06 NOTE — Progress Notes (Signed)
Progress Note  CC:     Abdominal pain and vomiting     ASSESSMENT AND PLAN:   Walter Kemp is a 29 y.o. male with a PMH signficant for, but not necessarily limited to,  HTN, pancreatitis, seizures, bipolar affective disorder, GERD, polysubstance abuse, appendectomy  # Hematemesis, RUQ pain  --Trivial elevation of ALT yesterday, remainder of LFTs unremarkable. No acute findings on RUQ [place]. No gallstones, CBD 2.7 mm. Non-contrast CT scan negative for acute abnormalities: patulous distal esophagus. EGD yesterday was unrevealing.  --HIDA scan pending. --Still having abdominal pain requiring Morphine      SUBJECTIVE   Vomited this am. Still has constant RUQ pain which gets sharp at times ( even in absence of food).     OBJECTIVE:    EGD 08/05/19  --There was some fresh heme in the orpharynx ?trauma. - LA Grade I distal esophagitis; the rest of the esophagus appeared normal; SCJ was measured at 45 cm. - Normal appearing stomach. - Normal examined duodenum. - No specimens collected   Vital signs in last 24 hours: Temp:  [97.9 F (36.6 C)-99 F (37.2 C)] 97.9 F (36.6 C) (06/21 0619) Pulse Rate:  [72-109] 84 (06/21 0619) Resp:  [17-25] 18 (06/21 0619) BP: (139-177)/(91-108) 145/99 (06/21 0624) SpO2:  [98 %-100 %] 98 % (06/21 0619) Weight:  [93 kg] 93 kg (06/20 1054) Last BM Date: 08/04/19 General:   Alert, in NAD Heart:  Regular rate and rhythm.  No lower extremity edema   Pulm: Normal respiratory effort   Abdomen:  Soft,  Generalized tenderness with voluntary guarding. Nondistended.  Normal bowel sounds.          Neurologic:  Alert and  oriented,  grossly normal neurologically. Psych:  Pleasant, cooperative.  Normal mood and affect.   Intake/Output from previous day: 06/20 0701 - 06/21 0700 In: 774.9 [I.V.:774.9] Out: 900 [Urine:900] Intake/Output this shift: No intake/output data recorded.  Lab Results: Recent Labs    08/04/19 2326 08/04/19 2326  08/05/19 1419 08/05/19 2207 08/06/19 0558  WBC 11.6*  --   --   --  5.4  HGB 14.7   < > 12.3* 11.5* 11.4*  HCT 43.1   < > 37.9* 35.1* 35.5*  PLT 473*  --   --   --  285   < > = values in this interval not displayed.   BMET Recent Labs    08/04/19 2326 08/06/19 0558  NA 141 139  K 3.7 3.9  CL 105 108  CO2 22 23  GLUCOSE 102* 95  BUN 20 14  CREATININE 1.08 0.87  CALCIUM 9.6 8.3*   LFT Recent Labs    08/06/19 0558  PROT 6.5  ALBUMIN 3.4*  AST 38  ALT 41  ALKPHOS 41  BILITOT 1.0   PT/INR Recent Labs    08/05/19 0047  LABPROT 13.2  INR 1.1   Hepatitis Panel No results for input(s): HEPBSAG, HCVAB, HEPAIGM, HEPBIGM in the last 72 hours.  CT Abdomen Pelvis Wo Contrast  Result Date: 08/05/2019 CLINICAL DATA:  Vomiting for several days with coffee-ground emesis. Tonight with bright red bleeding. History of bleeding from stomach in past. EXAM: CT ABDOMEN AND PELVIS WITHOUT CONTRAST TECHNIQUE: Multidetector CT imaging of the abdomen and pelvis was performed following the standard protocol without IV contrast. Patient declined enteric contrast. COMPARISON:  None. FINDINGS: Lower chest: Minor dependent atelectasis in the right greater than left lower lobe. Patulous distal esophagus. No evidence of pneumomediastinum. Hepatobiliary:  Calcified granuloma in the left lobe. No evidence of focal abnormality on noncontrast exam. Gallbladder physiologically distended, no calcified stone. No biliary dilatation. Pancreas: No ductal dilatation or inflammation. Spleen: Normal in size without focal abnormality. Splenule at the hilum Adrenals/Urinary Tract: Normal adrenal glands. No hydronephrosis. No perinephric edema. No renal calculi. Both ureters are decompressed without stones along the course. Bladder partially distended, no bladder wall thickening or stone. Stomach/Bowel: Patulous distal esophagus. No definite distal esophageal wall thickening. The unenhanced stomach is unremarkable. Normal  positioning of the duodenum and ligament of Treitz. There is no small bowel obstruction or inflammatory change. Prior appendectomy with surgical clips at the base of the cecum. Enteric chain sutures in the sigmoid which is slightly redundant. Moderate stool in the proximal colon, small volume of stool distally. No colonic wall thickening or inflammation. Vascular/Lymphatic: The abdominal aorta is normal in caliber. Prominent periportal nodes are likely reactive. Reproductive: Prostate is unremarkable. Other: No free air, free fluid, or intra-abdominal fluid collection. Postsurgical change of the anterior abdominal wall. Small supraumbilical fat containing abdominal wall hernia. Musculoskeletal: There are no acute or suspicious osseous abnormalities. Transitional lumbosacral anatomy. IMPRESSION: 1. No acute abnormality or explanation for hematemesis. 2. Patulous distal esophagus, nonspecific, can be seen with reflux. 3. Small fat containing supraumbilical abdominal wall hernia. Electronically Signed   By: Keith Rake M.D.   On: 08/05/2019 02:43   DG Chest Port 1 View  Result Date: 08/05/2019 CLINICAL DATA:  Initial evaluation for hematemesis, vomiting blood. EXAM: PORTABLE CHEST 1 VIEW COMPARISON:  Prior radiograph from 06/08/2014. FINDINGS: The cardiac and mediastinal silhouettes are stable in size and contour, and remain within normal limits. The lungs are normally inflated. No airspace consolidation, pleural effusion, or pulmonary edema. No pneumothorax. No acute osseous abnormality. IMPRESSION: No active cardiopulmonary disease. Electronically Signed   By: Jeannine Boga M.D.   On: 08/05/2019 01:08   US Abdomen Limited RUQ  Result Date: 08/05/2019 CLINICAL DATA:  Cholelithiasis. EXAM: ULTRASOUND ABDOMEN LIMITED RIGHT UPPER QUADRANT COMPARISON:  None. FINDINGS: Gallbladder: No gallstones or wall thickening visualized. No sonographic Murphy sign noted by sonographer. Common bile duct: Diameter:  2.7 mm. Liver: No focal lesion identified. Within normal limits in parenchymal echogenicity. Portal vein is patent on color Doppler imaging with normal direction of blood flow towards the liver. Other: None. IMPRESSION: No acute findings.  No evidence of cholelithiasis. Electronically Signed   By: Marin Olp M.D.   On: 08/05/2019 14:51    Principal Problem:   Hematemesis Active Problems:   Bipolar affective disorder ([place])   Hypertension     LOS: 1 day   Tye Savoy ,NP 08/06/2019, 9:36 AM

## 2019-08-06 NOTE — Progress Notes (Signed)
During intentional rounding. Patient stated that he forgot to mention that he believes he was bitten by a spider prior to coming to the ED Saturday 08/04/19. Bit is on his right hand, he is complaining of itching. Right hand is visibly swollen and patient states that he can't close his hand. AMION page sent to Dr. Verlon Au. Spoke w/ Dr. Verlon Au in person and he requested that I mark area w/ a marking pen and stated that he would come by and see patient.

## 2019-08-07 ENCOUNTER — Encounter: Payer: Self-pay | Admitting: Family Medicine

## 2019-08-07 ENCOUNTER — Inpatient Hospital Stay (HOSPITAL_COMMUNITY): Payer: Self-pay

## 2019-08-07 DIAGNOSIS — R109 Unspecified abdominal pain: Secondary | ICD-10-CM

## 2019-08-07 MED ORDER — DICYCLOMINE HCL 10 MG PO CAPS
10.0000 mg | ORAL_CAPSULE | Freq: Three times a day (TID) | ORAL | Status: DC
  Administered 2019-08-07: 10 mg via ORAL
  Filled 2019-08-07: qty 1

## 2019-08-07 MED ORDER — SUCRALFATE 1 G PO TABS
1.0000 g | ORAL_TABLET | Freq: Four times a day (QID) | ORAL | 1 refills | Status: DC
Start: 2019-08-07 — End: 2019-12-27

## 2019-08-07 MED ORDER — DIPHENHYDRAMINE HCL 50 MG/ML IJ SOLN
INTRAMUSCULAR | Status: AC
  Administered 2019-08-07: 25 mg via INTRAVENOUS
  Filled 2019-08-07: qty 1

## 2019-08-07 MED ORDER — PROMETHAZINE HCL 12.5 MG PO TABS: 12.5000 mg | ORAL_TABLET | Freq: Three times a day (TID) | ORAL | 0 refills | Status: DC | PRN

## 2019-08-07 MED ORDER — MORPHINE SULFATE (PF) 2 MG/ML IV SOLN
1.0000 mg | Freq: Once | INTRAVENOUS | Status: AC
  Administered 2019-08-07: 1 mg via INTRAVENOUS
  Filled 2019-08-07: qty 1

## 2019-08-07 MED ORDER — PANTOPRAZOLE SODIUM 40 MG PO TBEC: 40.0000 mg | DELAYED_RELEASE_TABLET | Freq: Two times a day (BID) | ORAL | 0 refills | Status: DC

## 2019-08-07 MED ORDER — DICYCLOMINE HCL 10 MG PO CAPS: 10.0000 mg | ORAL_CAPSULE | Freq: Three times a day (TID) | ORAL | 0 refills | Status: DC

## 2019-08-07 MED ORDER — ZOLPIDEM TARTRATE 5 MG PO TABS
5.0000 mg | ORAL_TABLET | Freq: Once | ORAL | Status: AC
  Administered 2019-08-07: 5 mg via ORAL
  Filled 2019-08-07: qty 1

## 2019-08-07 MED ORDER — DIPHENHYDRAMINE HCL 50 MG/ML IJ SOLN: 25.0000 mg | Freq: Once | INTRAMUSCULAR | Status: AC

## 2019-08-07 NOTE — Progress Notes (Signed)
Discharge instructions, medications, and follow up appointment reviewed with patient; verbalizes understanding.
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
## 2019-08-07 NOTE — Plan of Care (Signed)
Reviewed with patient on discharge; verbalizes understanding.

## 2019-08-07 NOTE — Progress Notes (Signed)
Progress Note   Subjective  Feeling improved today.Less pain, not resolved, but tolerating diet, wants to go home. HIDA negative, x ray with some nonspecific changes   Objective   Vital signs in last 24 hours: Temp:  [98 F (36.7 C)-98.4 F (36.9 C)] 98 F (36.7 C) (06/22 0453) Pulse Rate:  [93] 93 (06/21 2102) Resp:  [18-20] 20 (06/22 0453) BP: (147-156)/(98-105) 147/105 (06/22 0453) SpO2:  [99 %] 99 % (06/21 2102) Last BM Date: 08/06/19 General:    white male in NAD Abdomen:  Soft, nontender . Extremities:  Without edema. Neurologic:  Alert and oriented,  grossly normal neurologically. Psych:  Cooperative. Normal mood and affect.  Intake/Output from previous day: 06/21 0701 - 06/22 0700 In: 53.3 [I.V.:53.3] Out: -  Intake/Output this shift: No intake/output data recorded.  Lab Results: Recent Labs    08/04/19 2326 08/05/19 1419 08/05/19 2207 08/06/19 0558 08/06/19 1614  WBC 11.6*  --   --  5.4  --   HGB 14.7   < > 11.5* 11.4* 12.3*  HCT 43.1   < > 35.1* 35.5* 36.9*  PLT 473*  --   --  285  --    < > = values in this interval not displayed.   BMET Recent Labs    08/04/19 2326 08/06/19 0558  NA 141 139  K 3.7 3.9  CL 105 108  CO2 22 23  GLUCOSE 102* 95  BUN 20 14  CREATININE 1.08 0.87  CALCIUM 9.6 8.3*   LFT Recent Labs    08/06/19 0558  PROT 6.5  ALBUMIN 3.4*  AST 38  ALT 41  ALKPHOS 41  BILITOT 1.0   PT/INR Recent Labs    08/05/19 0047  LABPROT 13.2  INR 1.1    Studies/Results: DG Abd 1 View  Result Date: 08/07/2019 CLINICAL DATA:  Increasing mid abdominal pain and nausea. EXAM: ABDOMEN - 1 VIEW COMPARISON:  None. FINDINGS: A single loop of small bowel gas is noted in the mid abdomen. Wall thickening is noted in the descending colon. Proximal colon is unremarkable. No definite free air is present. No radiopaque stones are present. Axial skeleton is unremarkable. IMPRESSION: 1. Wall thickening in the descending colon.  Question  colitis. 2. Single loop of small bowel gas in the mid abdomen without obstruction or free air. This may represent ileus. The finding is within normal limits. Electronically Signed   By: San Morelle M.D.   On: 08/07/2019 05:58   NM Hepato W/EF  Result Date: 08/06/2019 CLINICAL DATA:  RIGHT upper quadrant pain. EXAM: NUCLEAR MEDICINE HEPATOBILIARY IMAGING WITH GALLBLADDER EF TECHNIQUE: Sequential images of the abdomen were obtained out to 60 minutes following intravenous administration of radiopharmaceutical. After oral ingestion of Ensure, gallbladder ejection fraction was determined. At 60 min, normal ejection fraction is greater than 33%. RADIOPHARMACEUTICALS:  5.5 mCi Tc-25m  Choletec IV COMPARISON:  CT and ultrasound 08/05/2019 FINDINGS: Prompt uptake and biliary excretion of activity by the liver is seen. Gallbladder activity is visualized, consistent with patency of cystic duct. Biliary activity passes into small bowel, consistent with patent common bile duct. Calculated gallbladder ejection fraction is 65%. (Normal gallbladder ejection fraction with Ensure is greater than 33%.) IMPRESSION: 1. Patent cystic duct and common bile duct. 2. No evidence of cholecystitis. 3. Normal gallbladder ejection fraction. Electronically Signed   By: Suzy Bouchard M.D.   On: 08/06/2019 17:36   US Abdomen Limited RUQ  Result Date: 08/05/2019 CLINICAL DATA:  Cholelithiasis. EXAM: ULTRASOUND ABDOMEN LIMITED RIGHT UPPER QUADRANT COMPARISON:  None. FINDINGS: Gallbladder: No gallstones or wall thickening visualized. No sonographic Murphy sign noted by sonographer. Common bile duct: Diameter: 2.7 mm. Liver: No focal lesion identified. Within normal limits in parenchymal echogenicity. Portal vein is patent on color Doppler imaging with normal direction of blood flow towards the liver. Other: None. IMPRESSION: No acute findings.  No evidence of cholelithiasis. Electronically Signed   By: Marin Olp M.D.   On:  08/05/2019 14:51       Assessment / Plan:    29 y/o male with prior history of pancreatitis, history of bipolar / seizure disorder, polysubstance abuse, hep C, who was admitted with abdominal pain, nausea / vomiting with some hematemesis.  EGD showed some mild esophagitis, otherwise, no significant pathology noted. Unclear if patient had some self limited bleeding from esophagitis, or Mallory weiss tear not seen, or perhaps even from oropharyngeal irritation, etc. He has not had any further episodes since the exam.   Otherwise regarding his pain, [place], CT scan, HIDA without clear cause. Labs normal, no evidence of pancreatitis. He is feeling improved today, wants to go home which I think is reasonable. I can follow him up in our office in the next few weeks for reassessment. Would continue BID protonix until that time and can give carafate PRN if that helps.   He will need referral to ID for hep C treatment as well, he wishes to pursue that.   Call with questions.   Loudon Cellar, MD [facility]

## 2019-08-08 ENCOUNTER — Telehealth: Payer: Self-pay

## 2019-08-08 NOTE — Telephone Encounter (Signed)
-----   Message from Yetta Flock, MD sent at 08/07/2019  1:19 PM EDT ----- Regarding: outpatient follow up [facility] can you help coordinate outpatient follow up for this patient in a few weeks with me or APP for abdominal pain, hep C. Thanks

## 2019-08-08 NOTE — Telephone Encounter (Signed)
Pt scheduled to see Tye Savoy NP 08/24/19 T 1:30pm. Appt letter mailed to pt.

## 2019-08-24 ENCOUNTER — Ambulatory Visit: Payer: Medicaid Other | Admitting: Nurse Practitioner

## 2019-08-30 ENCOUNTER — Encounter (HOSPITAL_COMMUNITY): Payer: Self-pay

## 2019-08-30 ENCOUNTER — Ambulatory Visit (HOSPITAL_COMMUNITY)
Admission: EM | Admit: 2019-08-30 | Discharge: 2019-08-30 | Disposition: A | Discharge status: Home / Self Care | Payer: Self-pay | Attending: Urgent Care | Admitting: Urgent Care

## 2019-08-30 ENCOUNTER — Emergency Department (HOSPITAL_COMMUNITY)
Admission: EM | Admit: 2019-08-30 | Discharge: 2019-08-30 | Discharge status: Against Medical Advice | Payer: Medicaid Other

## 2019-08-30 ENCOUNTER — Other Ambulatory Visit: Payer: Self-pay

## 2019-08-30 DIAGNOSIS — R030 Elevated blood-pressure reading, without diagnosis of hypertension: Secondary | ICD-10-CM

## 2019-08-30 DIAGNOSIS — L03113 Cellulitis of right upper limb: Secondary | ICD-10-CM

## 2019-08-30 DIAGNOSIS — M5442 Lumbago with sciatica, left side: Secondary | ICD-10-CM

## 2019-08-30 DIAGNOSIS — M79631 Pain in right forearm: Secondary | ICD-10-CM

## 2019-08-30 MED ORDER — TIZANIDINE HCL 4 MG PO TABS
4.0000 mg | ORAL_TABLET | Freq: Four times a day (QID) | ORAL | 5 refills | Status: AC | PRN
Start: 2019-08-30 — End: ?

## 2019-08-30 MED ORDER — PREDNISONE 20 MG PO TABS
ORAL_TABLET | ORAL | 0 refills | Status: DC
Start: 2019-08-30 — End: 2020-01-11

## 2019-08-30 MED ORDER — DOXYCYCLINE HYCLATE 100 MG PO CAPS
100.0000 mg | ORAL_CAPSULE | Freq: Two times a day (BID) | ORAL | 0 refills | Status: DC
Start: 2019-08-30 — End: 2019-12-02

## 2019-08-30 NOTE — ED Provider Notes (Signed)
[place]   MRN: [id number] DOB: 1990-10-01  Subjective:   Walter Kemp is a 29 y.o. male presenting for 3-day history of worsening right forearm pain from an insect bite.  Patient states that some drainage has come out from the right forearm.  He has had some redness and hot sensation over said area.  Also has recurrent lower back pain.  Has previously been told he has lumbar fracture that he has not resolved or had treated.  Has had intermittent low back pain with radiation into the left leg since then.  Would like a refill of his muscle relaxant, tizanidine and recommendations for a referral.  No current facility-administered medications for this encounter.  Current Outpatient Medications:  .  ARIPiprazole (ABILIFY) 5 MG tablet, Take 5 mg by mouth daily. , Disp: , Rfl:  .  atorvastatin (LIPITOR) 20 MG tablet, Take 20 mg by mouth daily. , Disp: , Rfl:  .  buPROPion (WELLBUTRIN SR) 150 MG 12 hr tablet, Take by mouth., Disp: , Rfl:  .  clonazePAM (KLONOPIN) 0.5 MG tablet, Take 0.5 mg by mouth at bedtime. Take every day per patient, Disp: , Rfl:  .  dicyclomine (BENTYL) 10 MG capsule, Take 1 capsule (10 mg total) by mouth 4 (four) times daily -  before meals and at bedtime., Disp: 60 capsule, Rfl: 0 .  gabapentin (NEURONTIN) 400 MG capsule, Take 400 mg by mouth 2 (two) times daily. , Disp: , Rfl:  .  hydrOXYzine (ATARAX/VISTARIL) 25 MG tablet, Take 1-2 tablets (25-50 mg total) by mouth every 4 (four) hours as needed., Disp: 20 tablet, Rfl: 0 .  NARCAN 4 MG/0.1ML LIQD nasal spray kit, USE AS DIRECTED BY PROVIDER, Disp: , Rfl:  .  pantoprazole (PROTONIX) 40 MG tablet, Take 1 tablet (40 mg total) by mouth 2 (two) times daily., Disp: 60 tablet, Rfl: 0 .  prazosin (MINIPRESS) 1 MG capsule, Take 1 mg by mouth at bedtime., Disp: , Rfl:  .  promethazine (PHENERGAN) 12.5 MG tablet, Take 1 tablet (12.5 mg total) by mouth every 8 (eight) hours as needed for refractory nausea / vomiting.,  Disp: 30 tablet, Rfl: 0 .  QUEtiapine (SEROQUEL) 100 MG tablet, Take 100 mg by mouth daily. , Disp: , Rfl:  .  sucralfate (CARAFATE) 1 g tablet, Take 1 tablet (1 g total) by mouth 4 (four) times daily., Disp: 120 tablet, Rfl: 1 .  tiZANidine (ZANAFLEX) 4 MG tablet, Take 1-2 tablets (4-8 mg total) by mouth every 6 (six) hours as needed for muscle spasms., Disp: 21 tablet, Rfl: 0   Allergies  Allergen Reactions  . Iodinated Diagnostic Agents Hives, Itching and Rash  . Shellfish Allergy Anaphylaxis  . Vancomycin Anaphylaxis and Hives    Throat Swelling per patient   . Nsaids Nausea And Vomiting and Other (See Comments)    GI distress   . Bee Venom Swelling  . Ondansetron     Makes his heart race  . Penicillins     unknown  . Metoclopramide Diarrhea    Past Medical History:  Diagnosis Date  . Bipolar 2 disorder ([place])    w depression and PTSD.  [name].    . Chronic generalized abdominal pain since at least 2013   GI MD felt he has functional abd pain.    . Complication of anesthesia    "had issues breathing" "had to be intubated"  . Gastroparesis 2012   Nuc med gastric emptying study: 12% emptying at  2 hours, normal range is 45 to 79%.  . 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  no viral counts in care everywhere (as of search 07/2019).    Marland Kitchen Hypertension   . Pancreatitis, alcoholic, acute 8756  . Seizures ([place])   . Stomach ulcer before 2013   H Pylori negative (biopsy and serum) on several occasions  . Substance abuse ([place]) 2015   heroin injection.       Past Surgical History:  Procedure Laterality Date  . ESOPHAGOGASTRODUODENOSCOPY  01/2016   for anemia, self reported hematemesis.  Dr Janus Molder, [facility].  entirly normal study.    . ESOPHAGOGASTRODUODENOSCOPY  10/2014   for dark, tarry stools.  Dr Ezzie Dural at [place].  normal EGD.    Marland Kitchen ESOPHAGOGASTRODUODENOSCOPY  11/2013   Dr Darlis Loan at [place].  for abd pain, n/v.  gastric scar  from healed PUD, gastritis.    Marland Kitchen ESOPHAGOGASTRODUODENOSCOPY  01/2013   Dr Ezzie Dural at [place].  for abd pain.  normal study.  felt to have functional abd pain  . ESOPHAGOGASTRODUODENOSCOPY (EGD) WITH PROPOFOL N/A 08/05/2019   Procedure: ESOPHAGOGASTRODUODENOSCOPY (EGD) WITH PROPOFOL;  Surgeon: Juanita Craver, MD;  Location: [facility] END[facility]COPY;  Service: Endoscopy;  Laterality: N/A;  . EXPLORATORY LAPAROTOMY  2012   for bleeding after lap appy.  at [facility]  . FLEXIBLE SIGMOIDOSCOPY  2011   Dr Scherrie November at [facility].  for reported rectal bleeding.  small, non-bleeding int rrhoids.    Marland Kitchen HAND SURGERY    . LAPAROSCOPIC APPENDECTOMY  2012   at [facility].  bleeding post surgery led to ex lap a few days after appy.    Marland Kitchen SHOULDER SURGERY      Family History  Adopted: Yes    Social History   Tobacco Use  . Smoking status: Former Smoker    Packs/day: 0.50    Types: Cigarettes    Quit date: 02/16/2015    Years since quitting: 4.5  . Smokeless tobacco: Never Used  Substance Use Topics  . Alcohol use: Not Currently  . Drug use: Not on file    ROS   Objective:   Vitals: BP (!) 167/124 (BP Location: Left Arm)   Pulse 96   Temp 98.4 F (36.9 C) (Oral)   Resp 18   Wt 210 lb (95.3 kg)   SpO2 99%   BMI 28.48 kg/m   BP Readings from Last 3 Encounters:  08/30/19 (!) 167/124  08/07/19 (!) 147/105  07/13/19 (!) 175/124   Physical Exam Constitutional:      General: He is not in acute distress.    Appearance: Normal appearance. He is well-developed and normal weight. He is not ill-appearing, toxic-appearing or diaphoretic.  HENT:     Head: Normocephalic and atraumatic.     Right Ear: External ear normal.     Left Ear: External ear normal.     Nose: Nose normal.     Mouth/Throat:     Pharynx: Oropharynx is clear.  Eyes:     General: No scleral icterus.       Right eye: No discharge.        Left eye: No discharge.     Extraocular Movements: Extraocular movements intact.     Pupils: Pupils are  equal, round, and reactive to light.  Cardiovascular:     Rate and Rhythm: Normal rate.  Pulmonary:     Effort: Pulmonary effort is normal.  Musculoskeletal:  Cervical back: Normal range of motion.     Lumbar back: Spasms and tenderness present. No swelling, edema, deformity, signs of trauma, lacerations or bony tenderness. Normal range of motion. Positive left straight leg raise test. Negative right straight leg raise test. No scoliosis.  Skin:    General: Skin is warm and dry.       Neurological:     Mental Status: He is alert and oriented to person, place, and time.     Motor: No weakness.     Coordination: Coordination normal.     Gait: Gait normal.     Deep Tendon Reflexes: Reflexes normal.  Psychiatric:        Mood and Affect: Mood normal.        Behavior: Behavior normal.        Thought Content: Thought content normal.        Judgment: Judgment normal.       Assessment and Plan :   PDMP not reviewed this encounter.  1. Right forearm pain   2. Cellulitis of right upper extremity   3. Acute left-sided low back pain with left-sided sciatica   4. Elevated blood pressure reading without diagnosis of hypertension     Start doxycycline to cover for cellulitis of right forearm.  Wound care reviewed.  Recommended prednisone course to be started tomorrow for recurrent low back pain, sciatica.  Refilled his muscle relaxant.  Provided information to [place] [facility].  Emphasized need for follow-up regarding his blood pressure, information provided for patient to establish with a new PCP. Counseled patient on potential for adverse effects with medications prescribed/recommended today, ER and return-to-clinic precautions discussed, patient verbalized understanding.    Jaynee Eagles, PA-C 08/30/19 2106

## 2019-08-30 NOTE — ED Triage Notes (Signed)
Pt is here with an insect bite that started 3 days ago this is on his right forearm. Pt is also having back pain that started a week ago, pt has a hx of back pain. Pt has not taken anything to relieve discomfort.

## 2019-11-11 ENCOUNTER — Ambulatory Visit (HOSPITAL_COMMUNITY)
Admission: EM | Admit: 2019-11-11 | Discharge: 2019-11-11 | Disposition: A | Discharge status: Home / Self Care | Payer: PRIVATE HEALTH INSURANCE

## 2019-11-11 ENCOUNTER — Emergency Department (HOSPITAL_COMMUNITY)
Admission: EM | Admit: 2019-11-11 | Discharge: 2019-11-11 | Disposition: A | Discharge status: Home / Self Care | Payer: PRIVATE HEALTH INSURANCE | Attending: Emergency Medicine | Admitting: Emergency Medicine

## 2019-11-11 ENCOUNTER — Encounter (HOSPITAL_COMMUNITY): Payer: Self-pay | Admitting: Emergency Medicine

## 2019-11-11 ENCOUNTER — Other Ambulatory Visit: Payer: Self-pay

## 2019-11-11 DIAGNOSIS — Z5321 Procedure and treatment not carried out due to patient leaving prior to being seen by health care provider: Secondary | ICD-10-CM | POA: Diagnosis not present

## 2019-11-11 DIAGNOSIS — K625 Hemorrhage of anus and rectum: Secondary | ICD-10-CM | POA: Diagnosis not present

## 2019-11-11 DIAGNOSIS — R110 Nausea: Secondary | ICD-10-CM | POA: Diagnosis not present

## 2019-11-11 DIAGNOSIS — R42 Dizziness and giddiness: Secondary | ICD-10-CM | POA: Diagnosis not present

## 2019-11-11 LAB — TYPE AND SCREEN
ABO/RH(D): O POS
Antibody Screen: NEGATIVE

## 2019-11-11 LAB — COMPREHENSIVE METABOLIC PANEL
ALT: 26 U/L (ref 0–44)
AST: 21 U/L (ref 15–41)
Albumin: 4.4 g/dL (ref 3.5–5.0)
Alkaline Phosphatase: 50 U/L (ref 38–126)
Anion gap: 14 (ref 5–15)
BUN: 26 mg/dL — ABNORMAL HIGH (ref 6–20)
CO2: 20 mmol/L — ABNORMAL LOW (ref 22–32)
Calcium: 9.8 mg/dL (ref 8.9–10.3)
Chloride: 105 mmol/L (ref 98–111)
Creatinine, Ser: 1.29 mg/dL — ABNORMAL HIGH (ref 0.61–1.24)
GFR calc Af Amer: >60 mL/min (ref >60)
GFR calc non Af Amer: >60 mL/min (ref >60)
Glucose, Bld: 101 mg/dL — ABNORMAL HIGH (ref 70–99)
Potassium: 4 mmol/L (ref 3.5–5.1)
Sodium: 139 mmol/L (ref 135–145)
Total Bilirubin: 0.8 mg/dL (ref 0.3–1.2)
Total Protein: 7.8 g/dL (ref 6.5–8.1)

## 2019-11-11 LAB — CBC
HCT: 43.7 % (ref 39.0–52.0)
Hemoglobin: 14.2 g/dL (ref 13.0–17.0)
MCH: 27.4 pg (ref 26.0–34.0)
MCHC: 32.5 g/dL (ref 30.0–36.0)
MCV: 84.4 fL (ref 80.0–100.0)
Platelets: 344 10*3/uL (ref 150–400)
RBC: 5.18 MIL/uL (ref 4.22–5.81)
RDW: 12.9 % (ref 11.5–15.5)
WBC: 9.9 10*3/uL (ref 4.0–10.5)
nRBC: 0 % (ref 0.0–0.2)

## 2019-11-11 NOTE — ED Triage Notes (Signed)
Pt reports bright red blood in stool.  States he has had rectal bleeding for 2 months but worse today with dizziness, "pins and needles" feeling all over, and nausea.

## 2019-11-11 NOTE — ED Triage Notes (Signed)
blood in stool for 2 months/ bright red blood today, swirls in toilet.  Complains of dizziness.  "warm fuzzy , pins and needles feeling throughout body".  Patient is anxious.

## 2019-11-11 NOTE — ED Notes (Signed)
Patient is being discharged from the Urgent Care and sent to the Emergency Department via private vehicle . Per [place], [place], patient is in need of higher level of care due to history and current complaint. Patient is aware and verbalizes understanding of plan of care.  Vitals:   11/11/19 1753 11/11/19 1756  BP: (!) 163/114 (!) 167/101  Pulse: 74   Resp: 18   Temp: 99.1 F (37.3 C)   SpO2: 97%

## 2019-11-11 NOTE — ED Notes (Signed)
Lwbs. 

## 2019-12-02 ENCOUNTER — Encounter (HOSPITAL_COMMUNITY): Payer: Self-pay | Admitting: Emergency Medicine

## 2019-12-02 ENCOUNTER — Ambulatory Visit (HOSPITAL_COMMUNITY)
Admission: EM | Admit: 2019-12-02 | Discharge: 2019-12-02 | Disposition: A | Discharge status: Home / Self Care | Payer: PRIVATE HEALTH INSURANCE | Attending: Family Medicine | Admitting: Family Medicine

## 2019-12-02 ENCOUNTER — Other Ambulatory Visit: Payer: Self-pay

## 2019-12-02 DIAGNOSIS — K0889 Other specified disorders of teeth and supporting structures: Secondary | ICD-10-CM

## 2019-12-02 DIAGNOSIS — K047 Periapical abscess without sinus: Secondary | ICD-10-CM

## 2019-12-02 DIAGNOSIS — R220 Localized swelling, mass and lump, head: Secondary | ICD-10-CM | POA: Diagnosis not present

## 2019-12-02 MED ORDER — HYDROCODONE-ACETAMINOPHEN 5-325 MG PO TABS: 1.0000 | ORAL_TABLET | Freq: Four times a day (QID) | ORAL | 0 refills | Status: AC | PRN

## 2019-12-02 MED ORDER — CLINDAMYCIN HCL 150 MG PO CAPS: 150.0000 mg | ORAL_CAPSULE | Freq: Four times a day (QID) | ORAL | 0 refills | Status: DC

## 2019-12-02 NOTE — Discharge Instructions (Addendum)
I have sent in clindamycin for you to take 4 times per day for 7 days  I have sent in hydrocodone for you to take one tablet every 6 hours as needed for pain  Follow up with your dentist tomorrow

## 2019-12-02 NOTE — ED Provider Notes (Signed)
[place]   [id number] 12/02/19 Arrival Time: 5462  CC: DENTAL pain  SUBJECTIVE:  Walter Kemp is a 29 y.o. male who presents with dental pain x 2 days. Reports that he was seen at his dentist last week and was prescribed a z-pack. Reports that this was working well until 2 days ago. Denies a precipitating event or trauma. Localizes pain to left upper jaw, with left facial swelling. Has tried OTC analgesics without relief. Worse with chewing. Does see a dentist regularly. Reports similar symptoms in the past. Denies fever, chills, dysphagia, odynophagia, oral or neck swelling, nausea, vomiting, chest pain, SOB.    ROS: As per HPI.  All other pertinent ROS negative.     Past Medical History:  Diagnosis Date  . Bipolar 2 disorder ([place])    w depression and PTSD.  [name].    . Chronic generalized abdominal pain since at least 2013   GI MD felt he has functional abd pain.    . Complication of anesthesia    "had issues breathing" "had to be intubated"  . Gastroparesis 2012   Nuc med gastric emptying study: 12% emptying at 2 hours, normal range is 45 to 79%.  . 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  no viral counts in care everywhere (as of search 07/2019).    Marland Kitchen Hypertension   . Pancreatitis, alcoholic, acute 7035  . Seizures ([place])   . Stomach ulcer before 2013   H Pylori negative (biopsy and serum) on several occasions  . 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     Past Surgical History:  Procedure Laterality Date  . ESOPHAGOGASTRODUODENOSCOPY  01/2016   for anemia, self reported hematemesis.  Dr Janus Molder, [facility].  entirly normal study.    . ESOPHAGOGASTRODUODENOSCOPY  10/2014   for dark, tarry stools.  Dr Ezzie Dural at [place].  normal EGD.    Marland Kitchen ESOPHAGOGASTRODUODENOSCOPY  11/2013   Dr Darlis Loan at [place].  for abd pain, n/v.  gastric scar from healed PUD, gastritis.    Marland Kitchen ESOPHAGOGASTRODUODENOSCOPY  01/2013   Dr  Ezzie Dural at [place].  for abd pain.  normal study.  felt to have functional abd pain  . ESOPHAGOGASTRODUODENOSCOPY (EGD) WITH PROPOFOL N/A 08/05/2019   Procedure: ESOPHAGOGASTRODUODENOSCOPY (EGD) WITH PROPOFOL;  Surgeon: Juanita Craver, MD;  Location: [facility];  Service: Endoscopy;  Laterality: N/A;  . EXPLORATORY LAPAROTOMY  2012   for bleeding after lap appy.  at [facility]  . FLEXIBLE SIGMOIDOSCOPY  2011   Dr Scherrie November at [facility].  for reported rectal bleeding.  small, non-bleeding int rrhoids.    Marland Kitchen HAND SURGERY    . LAPAROSCOPIC APPENDECTOMY  2012   at [facility].  bleeding post surgery led to ex lap a few days after appy.    Marland Kitchen SHOULDER SURGERY     Allergies  Allergen Reactions  . Iodinated Diagnostic Agents Hives, Itching and Rash  . Shellfish Allergy Anaphylaxis  . Vancomycin Anaphylaxis and Hives    Throat Swelling per patient   . Nsaids Nausea And Vomiting and Other (See Comments)    GI distress   . Bee Venom Swelling  . Ondansetron     Makes his heart race  . Penicillins     unknown  . Metoclopramide Diarrhea   No current facility-administered medications on file prior to encounter.   Current Outpatient Medications on File Prior to Encounter  Medication Sig Dispense Refill  .  ARIPiprazole (ABILIFY) 5 MG tablet Take 5 mg by mouth daily.     Marland Kitchen atorvastatin (LIPITOR) 20 MG tablet Take 20 mg by mouth daily.     Marland Kitchen buPROPion (WELLBUTRIN SR) 150 MG 12 hr tablet Take by mouth.    . clonazePAM (KLONOPIN) 0.5 MG tablet Take 0.5 mg by mouth at bedtime. Take every day per patient    . dicyclomine (BENTYL) 10 MG capsule Take 1 capsule (10 mg total) by mouth 4 (four) times daily -  before meals and at bedtime. 60 capsule 0  . gabapentin (NEURONTIN) 400 MG capsule Take 400 mg by mouth 2 (two) times daily.     . hydrOXYzine (ATARAX/VISTARIL) 25 MG tablet Take 1-2 tablets (25-50 mg total) by mouth every 4 (four) hours as needed. 20 tablet 0  . NARCAN 4 MG/0.1ML LIQD nasal spray kit USE AS  DIRECTED BY PROVIDER    . pantoprazole (PROTONIX) 40 MG tablet Take 1 tablet (40 mg total) by mouth 2 (two) times daily. 60 tablet 0  . prazosin (MINIPRESS) 1 MG capsule Take 1 mg by mouth at bedtime.    . predniSONE (DELTASONE) 20 MG tablet Take 2 tablets daily with breakfast. 10 tablet 0  . promethazine (PHENERGAN) 12.5 MG tablet Take 1 tablet (12.5 mg total) by mouth every 8 (eight) hours as needed for refractory nausea / vomiting. 30 tablet 0  . propranolol (INDERAL) 60 MG tablet Take 60 mg by mouth 3 (three) times daily.    . QUEtiapine (SEROQUEL) 100 MG tablet Take 100 mg by mouth daily.     . sucralfate (CARAFATE) 1 g tablet Take 1 tablet (1 g total) by mouth 4 (four) times daily. 120 tablet 1  . tiZANidine (ZANAFLEX) 4 MG tablet Take 1 tablet (4 mg total) by mouth every 6 (six) hours as needed for muscle spasms. 60 tablet 5  . [DISCONTINUED] omeprazole (PRILOSEC) 20 MG capsule Take 20 mg by mouth daily.    . [DISCONTINUED] zolpidem (AMBIEN) 10 MG tablet Take 10 mg by mouth at bedtime as needed for sleep.     Social History   Socioeconomic History  . Marital status: Single    Spouse name: Not on file  . Number of children: Not on file  . Years of education: Not on file  . Highest education level: Not on file  Occupational History  . Not on file  Tobacco Use  . Smoking status: Former Smoker    Packs/day: 0.50    Types: Cigarettes    Quit date: 02/16/2015    Years since quitting: 4.7  . Smokeless tobacco: Never Used  Substance and Sexual Activity  . Alcohol use: Not Currently  . Drug use: Not on file  . Sexual activity: Yes    Birth control/protection: Condom  Other Topics Concern  . Not on file  Social History Narrative  . Not on file   Social Determinants of Health   Financial Resource Strain:   . Difficulty of Paying Living Expenses: Not on file  Food Insecurity:   . Worried About Charity fundraiser in the Last Year: Not on file  . Ran Out of Food in the Last Year:  Not on file  Transportation Needs:   . Lack of Transportation (Medical): Not on file  . Lack of Transportation (Non-Medical): Not on file  Physical Activity:   . Days of Exercise per Week: Not on file  . Minutes of Exercise per Session: Not on file  Stress:   .  Feeling of Stress : Not on file  Social Connections:   . Frequency of Communication with Friends and Family: Not on file  . Frequency of Social Gatherings with Friends and Family: Not on file  . Attends Religious Services: Not on file  . Active Member of Clubs or Organizations: Not on file  . Attends Archivist Meetings: Not on file  . Marital Status: Not on file  Intimate Partner Violence:   . Fear of Current or Ex-Partner: Not on file  . Emotionally Abused: Not on file  . Physically Abused: Not on file  . Sexually Abused: Not on file   Family History  Adopted: Yes    OBJECTIVE:  Vitals:   12/02/19 1118  BP: (!) 169/112  Pulse: 72  Resp: 18  Temp: 98.6 F (37 C)  TempSrc: Oral  SpO2: 99%    General appearance: alert; no distress, left sided facial swelling HENT: normocephalic; atraumatic; dentition: multiple carries; poor over left upper gums without areas of fluctuance Neck: supple without LAD Lungs: normal respirations Skin: warm and dry Psychological: alert and cooperative; normal mood and affect  ASSESSMENT & PLAN:  1. Dental infection   2. Facial swelling   3. Pain, dental     Meds ordered this encounter  Medications  . clindamycin (CLEOCIN) 150 MG capsule    Sig: Take 1 capsule (150 mg total) by mouth every 6 (six) hours.    Dispense:  28 capsule    Refill:  0    Order Specific Question:   Supervising Provider    Answer:   Chase Picket [id number]  . HYDROcodone-acetaminophen (NORCO/VICODIN) 5-325 MG tablet    Sig: Take 1 tablet by mouth every 6 (six) hours as needed for up to 3 days for moderate pain or severe pain.    Dispense:  10 tablet    Refill:  0    Order Specific  Question:   Supervising Provider    Answer:   Chase Picket [id number]    Prescribed clindamycin Prescribed hydrocodone Recommend soft diet until evaluated by dentist Maintain oral hygiene care Follow up with dentist as soon as possible for further evaluation and treatment  Return or go to the ED if you have any new or worsening symptoms such as fever, chills, difficulty swallowing, painful swallowing, oral or neck swelling, nausea, vomiting, chest pain, SOB.  Reviewed expectations re: course of current medical issues. Questions answered. Outlined signs and symptoms indicating need for more acute intervention. Patient verbalized understanding. After Visit Summary given.   Faustino Congress, NP 12/02/19 1132

## 2019-12-02 NOTE — ED Triage Notes (Signed)
Pt presents with facial swelling xs 2 days. States given antibiotic by dentist 1 week ago.

## 2019-12-27 ENCOUNTER — Encounter: Payer: Self-pay | Admitting: Nurse Practitioner

## 2019-12-27 ENCOUNTER — Ambulatory Visit (INDEPENDENT_AMBULATORY_CARE_PROVIDER_SITE_OTHER): Payer: PRIVATE HEALTH INSURANCE | Admitting: Nurse Practitioner

## 2019-12-27 VITALS — BP 160/100 | HR 68 | Ht 72.0 in | Wt 208.6 lb

## 2019-12-27 DIAGNOSIS — B182 Chronic viral hepatitis C: Secondary | ICD-10-CM | POA: Diagnosis not present

## 2019-12-27 DIAGNOSIS — K219 Gastro-esophageal reflux disease without esophagitis: Secondary | ICD-10-CM

## 2019-12-27 DIAGNOSIS — K625 Hemorrhage of anus and rectum: Secondary | ICD-10-CM | POA: Diagnosis not present

## 2019-12-27 MED ORDER — SUTAB 1479-225-188 MG PO TABS: 1.0000 | ORAL_TABLET | Freq: Once | ORAL | 0 refills | Status: AC
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
## 2019-12-27 NOTE — Patient Instructions (Signed)
If you are age 31 or older, your body mass index should be between 23-30. Your Body mass index is 28.29 kg/m. If this is out of the aforementioned range listed, please consider follow up with your Primary Care Provider.  If you are age 64 or younger, your body mass index should be between 19-25. Your Body mass index is 28.29 kg/m. If this is out of the aformentioned range listed, please consider follow up with your Primary Care Provider.   You have been scheduled for a colonoscopy. Please follow written instructions given to you at your visit today.  Please pick up your prep supplies at the pharmacy within the next 1-3 days. If you use inhalers (even only as needed), please bring them with you on the day of your procedure.  Due to recent changes in healthcare laws, you may see the results of your imaging and laboratory studies on MyChart before your provider has had a chance to review them.  We understand that in some cases there may be results that are confusing or concerning to you. Not all laboratory results come back in the same time frame and the provider may be waiting for multiple results in order to interpret others.  Please give us 48 hours in order for your provider to thoroughly review all the results before contacting the office for clarification of your results.   

## 2019-12-28 ENCOUNTER — Encounter: Payer: Self-pay | Admitting: Nurse Practitioner

## 2019-12-28 ENCOUNTER — Other Ambulatory Visit: Payer: Self-pay | Admitting: Orthopedic Surgery

## 2019-12-28 ENCOUNTER — Other Ambulatory Visit (HOSPITAL_COMMUNITY): Payer: Self-pay | Admitting: Orthopedic Surgery

## 2019-12-28 DIAGNOSIS — M5459 Other low back pain: Secondary | ICD-10-CM

## 2019-12-31 NOTE — Progress Notes (Signed)
Addendum: Reviewed and agree with assessment and management plan. Would refer to ID for HCV treatment, this clinic is also treating hep C Thanks JMP Naome Brigandi, Lajuan Lines, MD

## 2020-01-14 ENCOUNTER — Ambulatory Visit (HOSPITAL_COMMUNITY)
Admission: RE | Admit: 2020-01-14 | Discharge: 2020-01-14 | Disposition: A | Discharge status: Home / Self Care | Payer: PRIVATE HEALTH INSURANCE | Source: Ambulatory Visit | Attending: Orthopedic Surgery | Admitting: Orthopedic Surgery

## 2020-01-14 ENCOUNTER — Telehealth: Payer: Self-pay

## 2020-01-14 DIAGNOSIS — Z01818 Encounter for other preprocedural examination: Secondary | ICD-10-CM | POA: Diagnosis not present

## 2020-01-14 DIAGNOSIS — Z20822 Contact with and (suspected) exposure to covid-19: Secondary | ICD-10-CM | POA: Diagnosis not present

## 2020-01-14 LAB — SARS CORONAVIRUS 2 (TAT 6-24 HRS): SARS Coronavirus 2: NEGATIVE

## 2020-01-14 NOTE — Telephone Encounter (Signed)
Spoke with patient. He could not come in early due to care partner having to work in the am before his procedure.

## 2020-01-14 NOTE — Telephone Encounter (Signed)
Pt returning your call, please return call

## 2020-01-14 NOTE — Telephone Encounter (Signed)
Pt scheduled to see Dr. Hilarie Fredrickson on 01/15/20 at 3pm. Left VM to see if pt would be interested in coming in for an earlier procedure on 01/15/20.

## 2020-01-15 ENCOUNTER — Encounter: Payer: Self-pay | Admitting: Internal Medicine

## 2020-01-15 ENCOUNTER — Ambulatory Visit (AMBULATORY_SURGERY_CENTER): Payer: PRIVATE HEALTH INSURANCE | Admitting: Internal Medicine

## 2020-01-15 ENCOUNTER — Other Ambulatory Visit: Payer: Self-pay

## 2020-01-15 VITALS — BP 142/88 | HR 58 | Temp 97.3°F | Resp 12 | Ht 72.0 in | Wt 208.0 lb

## 2020-01-15 DIAGNOSIS — D123 Benign neoplasm of transverse colon: Secondary | ICD-10-CM | POA: Diagnosis not present

## 2020-01-15 DIAGNOSIS — K625 Hemorrhage of anus and rectum: Secondary | ICD-10-CM | POA: Diagnosis not present

## 2020-01-15 DIAGNOSIS — K648 Other hemorrhoids: Secondary | ICD-10-CM | POA: Diagnosis not present

## 2020-01-15 HISTORY — PX: COLONOSCOPY: SHX174

## 2020-01-15 MED ORDER — SODIUM CHLORIDE 0.9 % IV SOLN: 500.0000 mL | Freq: Once | INTRAVENOUS | Status: DC

## 2020-01-15 NOTE — Progress Notes (Signed)
PT taken to PACU. Monitors in place. VSS. Report given to RN. 

## 2020-01-15 NOTE — Progress Notes (Signed)
VS- Walter Kemp  Pt ate yesterday- at 2000 had solid foods.  States he drank entire prep and results are clear.  Dr Hilarie Fredrickson made aware

## 2020-01-15 NOTE — Op Note (Signed)
[place] Patient Name: Walter Kemp Procedure Date: 01/15/2020 3:13 PM MRN: [id number] Endoscopist: Jerene Bears , MD Age: 29 Referring MD:  Date of Birth: June 03, 1990 Gender: Male Account #: [id number] Procedure:                Colonoscopy Indications:              Rectal bleeding Medicines:                Monitored Anesthesia Care Procedure:                Pre-Anesthesia Assessment:                           - Prior to the procedure, a History and Physical                            was performed, and patient medications and                            allergies were reviewed. The patient's tolerance of                            previous anesthesia was also reviewed. The risks                            and benefits of the procedure and the sedation                            options and risks were discussed with the patient.                            All questions were answered, and informed consent                            was obtained. Prior Anticoagulants: The patient has                            taken no previous anticoagulant or antiplatelet                            agents. ASA Grade Assessment: II - A patient with                            mild systemic disease. After reviewing the risks                            and benefits, the patient was deemed in                            satisfactory condition to undergo the procedure.                           After obtaining informed consent, the colonoscope  was passed under direct vision. Throughout the                            procedure, the patient's blood pressure, pulse, and                            oxygen saturations were monitored continuously. The                            Colonoscope was introduced through the anus and                            advanced to the cecum, identified by appendiceal                            orifice and ileocecal valve. The colonoscopy was                             performed without difficulty. The patient tolerated                            the procedure well. The quality of the bowel                            preparation was fair. The ileocecal valve,                            appendiceal orifice, and rectum were photographed. Scope In: 3:22:49 PM Scope Out: 3:40:10 PM Scope Withdrawal Time: 0 hours 14 minutes 28 seconds  Total Procedure Duration: 0 hours 17 minutes 21 seconds  Findings:                 The digital rectal exam was normal.                           A 5 mm polyp was found in the transverse colon. The                            polyp was sessile. The polyp was removed with a                            cold snare. Resection and retrieval were complete.                           Internal hemorrhoids were found during                            retroflexion. The hemorrhoids were small. Complications:            No immediate complications. Estimated Blood Loss:     Estimated blood loss was minimal. Impression:               - Preparation of the colon was fair. Vegetable  material (corn) was unable to be cleared leading to                            impaired visualization.                           - One 5 mm polyp in the transverse colon, removed                            with a cold snare. Resected and retrieved.                           - Internal hemorrhoids. Likely source of                            intermittent red blood per rectum. Recommendation:           - Patient has a contact number available for                            emergencies. The signs and symptoms of potential                            delayed complications were discussed with the                            patient. Return to normal activities tomorrow.                            Written discharge instructions were provided to the                            patient.                           - Resume  previous diet.                           - Continue present medications.                           - Await pathology results.                           - Repeat colonoscopy at appointment to be scheduled                            because the bowel preparation was poor. Two day                            prep with strict avoidance of high fiber foods and                            fresh vegetables 5 days before colonoscopy. Jerene Bears, MD 01/15/2020 3:46:07 PM This report has been signed electronically.

## 2020-01-15 NOTE — Progress Notes (Signed)
Called to room to assist during endoscopic procedure.  Patient ID and intended procedure confirmed with present staff. Received instructions for my participation in the procedure from the performing physician.  

## 2020-01-15 NOTE — Patient Instructions (Signed)
Information on polyps given to you today.  Await pathology results.  Resume previous diet and medications.  Repeat colonoscopy at appointment to be scheduled due to poor bowel prep.  YOU HAD AN ENDOSCOPIC PROCEDURE TODAY AT [facility]:   Refer to the procedure report that was given to you for any specific questions about what was found during the examination.  If the procedure report does not answer your questions, please call your gastroenterologist to clarify.  If you requested that your care partner not be given the details of your procedure findings, then the procedure report has been included in a sealed envelope for you to review at your convenience later.  YOU SHOULD EXPECT: Some feelings of bloating in the abdomen. Passage of more gas than usual.  Walking can help get rid of the air that was put into your GI tract during the procedure and reduce the bloating. If you had a lower endoscopy (such as a colonoscopy or flexible sigmoidoscopy) you may notice spotting of blood in your stool or on the toilet paper. If you underwent a bowel prep for your procedure, you may not have a normal bowel movement for a few days.  Please Note:  You might notice some irritation and congestion in your nose or some drainage.  This is from the oxygen used during your procedure.  There is no need for concern and it should clear up in a day or so.  SYMPTOMS TO REPORT IMMEDIATELY:   Following lower endoscopy (colonoscopy or flexible sigmoidoscopy):  Excessive amounts of blood in the stool  Significant tenderness or worsening of abdominal pains  Swelling of the abdomen that is new, acute  Fever of 100F or higher   For urgent or emergent issues, a gastroenterologist can be reached at any hour by calling [phone]. Do not use MyChart messaging for urgent concerns.    DIET:  We do recommend a small meal at first, but then you may proceed to your regular diet.  Drink plenty of fluids but  you should avoid alcoholic beverages for 24 hours.  ACTIVITY:  You should plan to take it easy for the rest of today and you should NOT DRIVE or use heavy machinery until tomorrow (because of the sedation medicines used during the test).    FOLLOW UP: Our staff will call the number listed on your records 48-72 hours following your procedure to check on you and address any questions or concerns that you may have regarding the information given to you following your procedure. If we do not reach you, we will leave a message.  We will attempt to reach you two times.  During this call, we will ask if you have developed any symptoms of COVID 19. If you develop any symptoms (ie: fever, flu-like symptoms, shortness of breath, cough etc.) before then, please call [phone].  If you test positive for Covid 19 in the 2 weeks post procedure, please call and report this information to [place].    If any biopsies were taken you will be contacted by phone or by letter within the next 1-3 weeks.  Please call us at [phone] if you have not heard about the biopsies in 3 weeks.    SIGNATURES/CONFIDENTIALITY: You and/or your care partner have signed paperwork which will be entered into your electronic medical record.  These signatures attest to the fact that that the information above on your After Visit Summary has been reviewed and is understood.  Full responsibility  of the confidentiality of this discharge information lies with you and/or your care-partner.

## 2020-01-16 ENCOUNTER — Encounter (HOSPITAL_COMMUNITY): Payer: Self-pay | Admitting: *Deleted

## 2020-01-16 NOTE — Progress Notes (Signed)
Patient denies shortness of breath, fever, cough or chest pain.  PCP - Junie Spencer, PA-C Cardiologist - n/a  Chest x-ray - 08/05/19 (1V) EKG - 10/31/19 CE- tracing requested Stress Test - n/a ECHO - n/a Cardiac Cath - n/a  Anesthesia review: Yes  STOP now taking any Aspirin (unless otherwise instructed by your surgeon), Aleve, Naproxen, Ibuprofen, Motrin, Advil, Goody's, BC's, all herbal medications, fish oil, and all vitamins.   Coronavirus Screening Covid test on 01/14/20 was negative.  Patient verbalized understanding of instructions that were given via phone.

## 2020-01-17 ENCOUNTER — Ambulatory Visit (HOSPITAL_COMMUNITY)
Admission: RE | Admit: 2020-01-17 | Discharge: 2020-01-17 | Disposition: A | Discharge status: Home / Self Care | Payer: PRIVATE HEALTH INSURANCE | Attending: Orthopedic Surgery | Admitting: Orthopedic Surgery

## 2020-01-17 ENCOUNTER — Ambulatory Visit (HOSPITAL_COMMUNITY)
Admission: RE | Admit: 2020-01-17 | Discharge: 2020-01-17 | Disposition: A | Discharge status: Home / Self Care | Payer: PRIVATE HEALTH INSURANCE | Source: Ambulatory Visit | Attending: Orthopedic Surgery | Admitting: Orthopedic Surgery

## 2020-01-17 ENCOUNTER — Telehealth: Payer: Self-pay | Admitting: *Deleted

## 2020-01-17 ENCOUNTER — Ambulatory Visit (HOSPITAL_COMMUNITY): Payer: PRIVATE HEALTH INSURANCE | Admitting: Physician Assistant

## 2020-01-17 ENCOUNTER — Encounter (HOSPITAL_COMMUNITY)
Admission: RE | Disposition: A | Discharge status: Home / Self Care | Payer: Self-pay | Source: Home / Self Care | Attending: Orthopedic Surgery

## 2020-01-17 ENCOUNTER — Encounter (HOSPITAL_COMMUNITY): Payer: Self-pay

## 2020-01-17 ENCOUNTER — Other Ambulatory Visit: Payer: Self-pay

## 2020-01-17 DIAGNOSIS — Z87820 Personal history of traumatic brain injury: Secondary | ICD-10-CM | POA: Diagnosis not present

## 2020-01-17 DIAGNOSIS — F40240 Claustrophobia: Secondary | ICD-10-CM | POA: Insufficient documentation

## 2020-01-17 DIAGNOSIS — F4310 Post-traumatic stress disorder, unspecified: Secondary | ICD-10-CM | POA: Diagnosis not present

## 2020-01-17 DIAGNOSIS — X58XXXA Exposure to other specified factors, initial encounter: Secondary | ICD-10-CM | POA: Diagnosis not present

## 2020-01-17 DIAGNOSIS — Z91030 Bee allergy status: Secondary | ICD-10-CM | POA: Insufficient documentation

## 2020-01-17 DIAGNOSIS — R531 Weakness: Secondary | ICD-10-CM | POA: Insufficient documentation

## 2020-01-17 DIAGNOSIS — Q057 Lumbar spina bifida without hydrocephalus: Secondary | ICD-10-CM | POA: Diagnosis not present

## 2020-01-17 DIAGNOSIS — Z880 Allergy status to penicillin: Secondary | ICD-10-CM | POA: Insufficient documentation

## 2020-01-17 DIAGNOSIS — M545 Low back pain, unspecified: Secondary | ICD-10-CM | POA: Diagnosis not present

## 2020-01-17 DIAGNOSIS — M5459 Other low back pain: Secondary | ICD-10-CM

## 2020-01-17 DIAGNOSIS — Z79899 Other long term (current) drug therapy: Secondary | ICD-10-CM | POA: Insufficient documentation

## 2020-01-17 DIAGNOSIS — M79605 Pain in left leg: Secondary | ICD-10-CM | POA: Diagnosis not present

## 2020-01-17 DIAGNOSIS — M5116 Intervertebral disc disorders with radiculopathy, lumbar region: Secondary | ICD-10-CM | POA: Diagnosis not present

## 2020-01-17 DIAGNOSIS — F419 Anxiety disorder, unspecified: Secondary | ICD-10-CM | POA: Insufficient documentation

## 2020-01-17 DIAGNOSIS — Z7952 Long term (current) use of systemic steroids: Secondary | ICD-10-CM | POA: Diagnosis not present

## 2020-01-17 DIAGNOSIS — Z87891 Personal history of nicotine dependence: Secondary | ICD-10-CM | POA: Insufficient documentation

## 2020-01-17 HISTORY — DX: Inflammatory liver disease, unspecified: K75.9

## 2020-01-17 HISTORY — PX: RADIOLOGY WITH ANESTHESIA: SHX6223

## 2020-01-17 SURGERY — MRI WITH ANESTHESIA
Anesthesia: General

## 2020-01-17 MED ORDER — CHLORHEXIDINE GLUCONATE 0.12 % MT SOLN
15.0000 mL | Freq: Once | OROMUCOSAL | Status: DC
  Filled 2020-01-17: qty 15

## 2020-01-17 MED ORDER — ORAL CARE MOUTH RINSE: 15.0000 mL | Freq: Once | OROMUCOSAL | Status: DC

## 2020-01-17 MED ORDER — MIDAZOLAM HCL 5 MG/5ML IJ SOLN
INTRAMUSCULAR | Status: DC | PRN
  Administered 2020-01-17: 2 mg via INTRAVENOUS

## 2020-01-17 MED ORDER — DEXMEDETOMIDINE (PRECEDEX) IN NS 20 MCG/5ML (4 MCG/ML) IV SYRINGE
PREFILLED_SYRINGE | INTRAVENOUS | Status: AC
  Filled 2020-01-17: qty 10

## 2020-01-17 MED ORDER — FENTANYL CITRATE (PF) 100 MCG/2ML IJ SOLN
INTRAMUSCULAR | Status: DC | PRN
  Administered 2020-01-17: 100 ug via INTRAVENOUS

## 2020-01-17 MED ORDER — OXYCODONE HCL 5 MG PO TABS
5.0000 mg | ORAL_TABLET | Freq: Once | ORAL | Status: AC
  Administered 2020-01-17: 5 mg via ORAL

## 2020-01-17 MED ORDER — LACTATED RINGERS IV SOLN: INTRAVENOUS | Status: DC

## 2020-01-17 MED ORDER — PROMETHAZINE HCL 25 MG/ML IJ SOLN
6.2500 mg | Freq: Once | INTRAMUSCULAR | Status: AC
  Administered 2020-01-17: 6.25 mg via INTRAVENOUS
  Filled 2020-01-17: qty 1

## 2020-01-17 MED ORDER — PROMETHAZINE HCL 25 MG/ML IJ SOLN
INTRAMUSCULAR | Status: AC
  Filled 2020-01-17: qty 1

## 2020-01-17 MED ORDER — PROPOFOL 1000 MG/100ML IV EMUL
INTRAVENOUS | Status: AC
  Filled 2020-01-17: qty 100

## 2020-01-17 MED ORDER — SUCCINYLCHOLINE CHLORIDE 20 MG/ML IJ SOLN
INTRAMUSCULAR | Status: DC | PRN
  Administered 2020-01-17: 80 mg via INTRAVENOUS

## 2020-01-17 MED ORDER — AMISULPRIDE (ANTIEMETIC) 5 MG/2ML IV SOLN
INTRAVENOUS | Status: AC
  Filled 2020-01-17: qty 2

## 2020-01-17 MED ORDER — PROMETHAZINE HCL 12.5 MG PO TABS
12.5000 mg | ORAL_TABLET | Freq: Once | ORAL | Status: AC
  Administered 2020-01-17: 12.5 mg via ORAL
  Filled 2020-01-17: qty 1

## 2020-01-17 MED ORDER — PROPOFOL 10 MG/ML IV BOLUS
INTRAVENOUS | Status: DC | PRN
  Administered 2020-01-17: 350 mg via INTRAVENOUS

## 2020-01-17 MED ORDER — DEXAMETHASONE SODIUM PHOSPHATE 10 MG/ML IJ SOLN
INTRAMUSCULAR | Status: DC | PRN
  Administered 2020-01-17: 10 mg via INTRAVENOUS

## 2020-01-17 MED ORDER — OXYCODONE HCL 5 MG PO TABS
ORAL_TABLET | ORAL | Status: AC
  Filled 2020-01-17: qty 1

## 2020-01-17 MED ORDER — CLONAZEPAM 0.5 MG PO TABS
0.5000 mg | ORAL_TABLET | Freq: Once | ORAL | Status: AC
  Administered 2020-01-17: 0.5 mg via ORAL
  Filled 2020-01-17: qty 1

## 2020-01-17 MED ORDER — LIDOCAINE 2% (20 MG/ML) 5 ML SYRINGE
INTRAMUSCULAR | Status: DC | PRN
  Administered 2020-01-17: 20 mg via INTRAVENOUS

## 2020-01-17 NOTE — Transfer of Care (Signed)
Immediate Anesthesia Transfer of Care Note  Patient: Walter Kemp  Procedure(s) Performed: MRI WITH ANESTHESIA LUMBAR SPINE WITHOUT CONTRAST (N/A )  Patient Location: PACU  Anesthesia Type:General  Level of Consciousness: awake, alert , oriented and patient cooperative  Airway & Oxygen Therapy: Patient Spontanous Breathing and Patient connected to nasal cannula oxygen  Post-op Assessment: Report given to RN and Post -op Vital signs reviewed and stable  Post vital signs: Reviewed and stable  Last Vitals:  Vitals Value Taken Time  BP 162/103 01/17/20 0946  Temp 36.7 C 01/17/20 0945  Pulse 76 01/17/20 0947  Resp 15 01/17/20 0947  SpO2 99 % 01/17/20 0947  Vitals shown include unvalidated device data.  Last Pain: There were no vitals filed for this visit.       Complications: No complications documented.

## 2020-01-17 NOTE — Telephone Encounter (Signed)
  Follow up Call-  Call back number 01/15/2020  Post procedure Call Back phone  # [phone]  Permission to leave phone message Yes  Some recent data might be hidden     First attempt for follow up phone call. No answer at number given.  Left message on voicemail.

## 2020-01-17 NOTE — Telephone Encounter (Signed)
  Follow up Call-  Call back number 01/15/2020  Post procedure Call Back phone  # [phone]  Permission to leave phone message Yes  Some recent data might be hidden     Patient questions:  Do you have a fever, pain , or abdominal swelling? No. Pain Score  0 *  Have you tolerated food without any problems? Yes.    Have you been able to return to your normal activities? Yes.    Do you have any questions about your discharge instructions: Diet   No. Medications  No. Follow up visit  No.  Do you have questions or concerns about your Care? No.  Actions: * If pain score is 4 or above: No action needed, pain <4.  1. Have you developed a fever since your procedure? no  2.   Have you had an respiratory symptoms (SOB or cough) since your procedure? no  3.   Have you tested positive for COVID 19 since your procedure no  4.   Have you had any family members/close contacts diagnosed with the COVID 19 since your procedure?  no   If yes to any of these questions please route to Joylene John, RN and Joella Prince, RN

## 2020-01-17 NOTE — Anesthesia Postprocedure Evaluation (Signed)
Anesthesia Post Note  Patient: Walter Kemp  Procedure(s) Performed: MRI WITH ANESTHESIA LUMBAR SPINE WITHOUT CONTRAST (N/A )     Patient location during evaluation: PACU Anesthesia Type: General Level of consciousness: awake and alert Pain management: pain level controlled Vital Signs Assessment: post-procedure vital signs reviewed and stable Respiratory status: spontaneous breathing, nonlabored ventilation, respiratory function stable and patient connected to nasal cannula oxygen Cardiovascular status: blood pressure returned to baseline and stable Postop Assessment: no apparent nausea or vomiting Anesthetic complications: no   No complications documented.  Last Vitals:  Vitals:   01/17/20 0946 01/17/20 1000  BP: (!) 162/103 (!) 179/103  Pulse:  76  Resp:  16  Temp:  36.7 C  SpO2:  99%    Last Pain:  Vitals:   01/17/20 0945  PainSc: 7                  Erez Mccallum

## 2020-01-17 NOTE — Anesthesia Preprocedure Evaluation (Signed)
Anesthesia Evaluation  Patient identified by MRN, date of birth, ID band Patient awake    Reviewed: Allergy & Precautions, NPO status , Patient's Chart, lab work & pertinent test results  History of Anesthesia Complications Negative for: history of anesthetic complications  Airway Mallampati: I  TM Distance: >3 FB Neck ROM: Full    Dental  (+) Dental Advisory Given, Chipped, Poor Dentition,    Pulmonary neg pulmonary ROS, former smoker,  Covid-19 Nucleic Acid Test Results Lab Results      Component                Value               Date                      SARSCOV2NAA              NEGATIVE            01/14/2020                Alger              NEGATIVE            08/05/2019              breath sounds clear to auscultation       Cardiovascular negative cardio ROS   Rhythm:Regular     Neuro/Psych PSYCHIATRIC DISORDERS Anxiety Depression Bipolar Disorder PTSDnegative neurological ROS     GI/Hepatic PUD, GERD  Medicated and Controlled,No results found for: HAV, HEPAIGM, HEPBIGM, HEPBCAB, HBEAG, HEPCAB Lab Results      Component                Value               Date                      ALT                      26                  11/11/2019                AST                      21                  11/11/2019                ALKPHOS                  50                  11/11/2019                BILITOT                  0.8                 11/11/2019          '   Endo/Other  negative endocrine ROS  Renal/GU      Musculoskeletal negative musculoskeletal ROS (+)   Abdominal   Peds  Hematology negative hematology ROS (+) Lab Results      Component  Value               Date                      WBC                      9.9                 11/11/2019                HGB                      14.2                11/11/2019                HCT                      43.7                11/11/2019                 MCV                      84.4                11/11/2019                PLT                      344                 11/11/2019              Anesthesia Other Findings   Reproductive/Obstetrics                             Anesthesia Physical Anesthesia Plan  ASA: II  Anesthesia Plan: General   Post-op Pain Management:    Induction: Intravenous  PONV Risk Score and Plan: 2 and Ondansetron and Dexamethasone  Airway Management Planned: Oral ETT  Additional Equipment: None  Intra-op Plan:   Post-operative Plan: Extubation in OR  Informed Consent: I have reviewed the patients History and Physical, chart, labs and discussed the procedure including the risks, benefits and alternatives for the proposed anesthesia with the patient or authorized representative who has indicated his/her understanding and acceptance.     Dental advisory given  Plan Discussed with: CRNA and Surgeon  Anesthesia Plan Comments:         Anesthesia Quick Evaluation

## 2020-01-17 NOTE — Anesthesia Procedure Notes (Signed)
Procedure Name: Intubation Date/Time: 01/17/2020 8:55 AM Performed by: Cleda Daub, CRNA Pre-anesthesia Checklist: Patient identified, Emergency Drugs available, Suction available and Patient being monitored Patient Re-evaluated:Patient Re-evaluated prior to induction Oxygen Delivery Method: Circle system utilized Preoxygenation: Pre-oxygenation with 100% oxygen Induction Type: IV induction Ventilation: Mask ventilation without difficulty Laryngoscope Size: Mac and 4 Grade View: Grade I Tube type: Oral Tube size: 7.5 mm Number of attempts: 1 Airway Equipment and Method: Stylet and Oral airway Placement Confirmation: ETT inserted through vocal cords under direct vision,  positive ETCO2 and breath sounds checked- equal and bilateral Secured at: 23 cm Tube secured with: Tape Dental Injury: Teeth and Oropharynx as per pre-operative assessment

## 2020-01-18 ENCOUNTER — Encounter (HOSPITAL_COMMUNITY): Payer: Self-pay | Admitting: Radiology

## 2020-01-22 ENCOUNTER — Encounter: Payer: Self-pay | Admitting: Internal Medicine

## 2020-02-04 ENCOUNTER — Other Ambulatory Visit: Payer: Self-pay

## 2020-02-04 ENCOUNTER — Ambulatory Visit (AMBULATORY_SURGERY_CENTER): Payer: PRIVATE HEALTH INSURANCE | Admitting: *Deleted

## 2020-02-04 VITALS — Ht 72.0 in | Wt 216.0 lb

## 2020-02-04 DIAGNOSIS — K625 Hemorrhage of anus and rectum: Secondary | ICD-10-CM

## 2020-02-04 DIAGNOSIS — Z86010 Personal history of colonic polyps: Secondary | ICD-10-CM

## 2020-02-04 MED ORDER — GOLYTELY 236 G PO SOLR: 4000.0000 mL | Freq: Once | ORAL | 0 refills | Status: AC

## 2020-02-04 NOTE — Progress Notes (Signed)
Patient is here in-person for PV. Patient denies any allergies to eggs or soy. Patient denies any problems with anesthesia/sedation. Patient denies any oxygen use at home. Patient denies taking any diet/weight loss medications or blood thinners. Patient is not being treated for MRSA or C-diff. Patient is aware of our care-partner policy and SFKCL-27 safety protocol. EMMI education assigned to the patient for the procedure, handout given to pt.   COVID-19 vaccines completed on 06/2019, per patient.
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
## 2020-02-13 ENCOUNTER — Encounter: Payer: Self-pay | Admitting: Internal Medicine

## 2020-02-13 ENCOUNTER — Ambulatory Visit (AMBULATORY_SURGERY_CENTER): Payer: PRIVATE HEALTH INSURANCE | Admitting: Internal Medicine

## 2020-02-13 ENCOUNTER — Other Ambulatory Visit: Payer: Self-pay

## 2020-02-13 VITALS — BP 138/82 | HR 57 | Temp 96.3°F | Resp 8 | Ht 72.0 in | Wt 216.0 lb

## 2020-02-13 DIAGNOSIS — Z86010 Personal history of colonic polyps: Secondary | ICD-10-CM

## 2020-02-13 DIAGNOSIS — K625 Hemorrhage of anus and rectum: Secondary | ICD-10-CM

## 2020-02-13 DIAGNOSIS — Z538 Procedure and treatment not carried out for other reasons: Secondary | ICD-10-CM | POA: Diagnosis not present

## 2020-02-13 HISTORY — PX: COLONOSCOPY: SHX174

## 2020-02-13 MED ORDER — SODIUM CHLORIDE 0.9 % IV SOLN: 500.0000 mL | Freq: Once | INTRAVENOUS | Status: DC

## 2020-02-13 NOTE — Progress Notes (Signed)
Pt's states no medical or surgical changes since previsit or office visit.   Vitals AG 

## 2020-02-13 NOTE — Progress Notes (Signed)
Report given to PACU, vss 

## 2020-02-13 NOTE — Patient Instructions (Addendum)
Handout was given to you on hemorrhoids. You may resume your current medications today. Call back to [facility] #[phone] when you are ready to reschedule the colonoscopy.  Please call if any questions or concerns.    YOU HAD AN ENDOSCOPIC PROCEDURE TODAY AT THE [facility]:   Refer to the procedure report that was given to you for any specific questions about what was found during the examination.  If the procedure report does not answer your questions, please call your gastroenterologist to clarify.  If you requested that your care partner not be given the details of your procedure findings, then the procedure report has been included in a sealed envelope for you to review at your convenience later.  YOU SHOULD EXPECT: Some feelings of bloating in the abdomen. Passage of more gas than usual.  Walking can help get rid of the air that was put into your GI tract during the procedure and reduce the bloating. If you had a lower endoscopy (such as a colonoscopy or flexible sigmoidoscopy) you may notice spotting of blood in your stool or on the toilet paper. If you underwent a bowel prep for your procedure, you may not have a normal bowel movement for a few days.  Please Note:  You might notice some irritation and congestion in your nose or some drainage.  This is from the oxygen used during your procedure.  There is no need for concern and it should clear up in a day or so.  SYMPTOMS TO REPORT IMMEDIATELY:   Following lower endoscopy (colonoscopy or flexible sigmoidoscopy):  Excessive amounts of blood in the stool  Significant tenderness or worsening of abdominal pains  Swelling of the abdomen that is new, acute  Fever of 100F or higher   For urgent or emergent issues, a gastroenterologist can be reached at any hour by calling [phone]. Do not use MyChart messaging for urgent concerns.    DIET:  We do recommend a small meal at first, but then you may  proceed to your regular diet.  Drink plenty of fluids but you should avoid alcoholic beverages for 24 hours.  ACTIVITY:  You should plan to take it easy for the rest of today and you should NOT DRIVE or use heavy machinery until tomorrow (because of the sedation medicines used during the test).    FOLLOW UP: Our staff will call the number listed on your records 48-72 hours following your procedure to check on you and address any questions or concerns that you may have regarding the information given to you following your procedure. If we do not reach you, we will leave a message.  We will attempt to reach you two times.  During this call, we will ask if you have developed any symptoms of COVID 19. If you develop any symptoms (ie: fever, flu-like symptoms, shortness of breath, cough etc.) before then, please call [phone].  If you test positive for Covid 19 in the 2 weeks post procedure, please call and report this information to [place].    If any biopsies were taken you will be contacted by phone or by letter within the next 1-3 weeks.  Please call us at [phone] if you have not heard about the biopsies in 3 weeks.    SIGNATURES/CONFIDENTIALITY: You and/or your care partner have signed paperwork which will be entered into your electronic medical record.  These signatures attest to the fact that that the information above on your After Visit Summary has  been reviewed and is understood.  Full responsibility of the confidentiality of this discharge information lies with you and/or your care-partner.

## 2020-02-13 NOTE — Progress Notes (Signed)
Per Dr. Rhea Belton, he told pt to call us back when he is ready to reschedule the colonoscopy d/t poor prep.   Pt has Dr. Lauro Franklin card with number.  No problems noted in the recovery room. maw

## 2020-02-13 NOTE — Op Note (Signed)
[place] Patient Name: Walter Kemp Procedure Date: 02/13/2020 8:35 AM MRN: [id number] Endoscopist: Jerene Bears , MD Age: 29 Referring MD:  Date of Birth: October 29, 1990 Gender: Male Account #: [id number] Procedure:                Colonoscopy Indications:              Rectal bleeding, poor preparation at colonoscopy 1                            month ago, 1 tubular adenoma removed at colonoscopy                            1 month ago, now returns after 2 day prep Medicines:                Monitored Anesthesia Care Procedure:                Pre-Anesthesia Assessment:                           - Prior to the procedure, a History and Physical                            was performed, and patient medications and                            allergies were reviewed. The patient's tolerance of                            previous anesthesia was also reviewed. The risks                            and benefits of the procedure and the sedation                            options and risks were discussed with the patient.                            All questions were answered, and informed consent                            was obtained. Prior Anticoagulants: The patient has                            taken no previous anticoagulant or antiplatelet                            agents. ASA Grade Assessment: III - A patient with                            severe systemic disease. After reviewing the risks                            and benefits, the patient was deemed in  satisfactory condition to undergo the procedure.                           After obtaining informed consent, the colonoscope                            was passed under direct vision. Throughout the                            procedure, the patient's blood pressure, pulse, and                            oxygen saturations were monitored continuously. The                            Olympus  CF-HQ190L (Serial# [id number]) Colonoscope was                            introduced through the anus with the intention of                            advancing to the cecum. The scope was advanced to                            the transverse colon before the procedure was                            aborted. Medications were given. The colonoscopy                            was performed without difficulty. The patient                            tolerated the procedure well. The quality of the                            bowel preparation was unsatisfactory despite 2 day                            prep (MiraLax and Golytely). The rectum was                            photographed. Scope In: 8:43:01 AM Scope Out: 8:47:36 AM Total Procedure Duration: 0 hours 4 minutes 35 seconds  Findings:                 The digital rectal exam was normal.                           A large amount of semi-solid stool was found in the                            recto-sigmoid colon, in the sigmoid colon and in  the transverse colon, interfering with                            visualization.                           Internal hemorrhoids were found during                            retroflexion. The hemorrhoids were small. Complications:            No immediate complications. Estimated Blood Loss:     Estimated blood loss: none. Impression:               - Preparation of the colon was unsatisfactory.                           - Stool in the recto-sigmoid colon, in the sigmoid                            colon and in the transverse colon.                           - Internal hemorrhoids. Most likely source of                            intermittent rectal bleeding (though colon not                            fully visualized).                           - No specimens collected. Recommendation:           - Patient has a contact number available for                            emergencies. The  signs and symptoms of potential                            delayed complications were discussed with the                            patient. Return to normal activities tomorrow.                            Written discharge instructions were provided to the                            patient.                           - Resume previous diet.                           - Continue present medications.                           -  Repeat colonoscopy because the bowel preparation                            was suboptimal. This was the 2nd attempt at                            achieving an adequate preparation. The endoscopy                            center is closed through the weekend so repeat                            preparation tonight is not an option. If patient                            willing we can try repeat 2 day bowel preparation.                            Metoclopramide can be used before each portion of                            the preparation to improve tolerability and reduce                            nausea/vomiting. Jerene Bears, MD 02/13/2020 8:57:47 AM This report has been signed electronically.

## 2020-02-18 ENCOUNTER — Telehealth: Payer: Self-pay

## 2020-02-18 ENCOUNTER — Telehealth: Payer: Self-pay | Admitting: *Deleted

## 2020-02-18 NOTE — Telephone Encounter (Signed)
Called [phone] and I left a message that we called to follow up from his procedure with Dr. Rhea Belton.maw

## 2020-02-18 NOTE — Telephone Encounter (Signed)
  Follow up Call-  Call back number 02/13/2020 01/15/2020  Post procedure Call Back phone  # [phone]  Permission to leave phone message Yes Yes  Some recent data might be hidden     Patient questions:  Do you have a fever, pain , or abdominal swelling? No. Pain Score  0 *  Have you tolerated food without any problems? Yes.    Have you been able to return to your normal activities? Yes.    Do you have any questions about your discharge instructions: Diet   No. Medications  No. Follow up visit  No.  Do you have questions or concerns about your Care? No.  Actions: * If pain score is 4 or above: No action needed, pain <4  1. Have you developed a fever since your procedure? NO  2.   Have you had an respiratory symptoms (SOB or cough) since your procedure? NO  3.   Have you tested positive for COVID 19 since your procedure NO  4.   Have you had any family members/close contacts diagnosed with the COVID 19 since your procedure?  NO   If yes to any of these questions please route to Laverna Peace, RN and Karlton Lemon, RN

## 2020-02-28 ENCOUNTER — Other Ambulatory Visit: Payer: PRIVATE HEALTH INSURANCE

## 2020-05-06 ENCOUNTER — Other Ambulatory Visit (HOSPITAL_COMMUNITY): Payer: Self-pay | Admitting: Physical Medicine and Rehabilitation

## 2020-05-06 ENCOUNTER — Other Ambulatory Visit: Payer: Self-pay | Admitting: Physical Medicine and Rehabilitation

## 2020-05-07 ENCOUNTER — Other Ambulatory Visit (HOSPITAL_COMMUNITY): Payer: Self-pay | Admitting: Physical Medicine and Rehabilitation

## 2020-05-07 DIAGNOSIS — M545 Low back pain, unspecified: Secondary | ICD-10-CM

## 2020-05-07 DIAGNOSIS — M25552 Pain in left hip: Secondary | ICD-10-CM

## 2020-05-08 ENCOUNTER — Telehealth (HOSPITAL_COMMUNITY): Payer: Self-pay

## 2020-05-23 ENCOUNTER — Ambulatory Visit (HOSPITAL_COMMUNITY)
Admission: RE | Admit: 2020-05-23 | Discharge: 2020-05-23 | Disposition: A | Discharge status: Home / Self Care | Payer: PRIVATE HEALTH INSURANCE | Source: Ambulatory Visit | Attending: Physical Medicine and Rehabilitation | Admitting: Physical Medicine and Rehabilitation

## 2020-05-23 DIAGNOSIS — Z01812 Encounter for preprocedural laboratory examination: Secondary | ICD-10-CM | POA: Insufficient documentation

## 2020-05-23 DIAGNOSIS — Z20822 Contact with and (suspected) exposure to covid-19: Secondary | ICD-10-CM | POA: Diagnosis not present

## 2020-05-23 LAB — SARS CORONAVIRUS 2 (TAT 6-24 HRS): SARS Coronavirus 2: NEGATIVE

## 2020-05-26 ENCOUNTER — Encounter (HOSPITAL_COMMUNITY): Payer: Self-pay | Admitting: *Deleted

## 2020-05-26 NOTE — Progress Notes (Signed)
Spoke with pt for pre-op call. Pt denies cardiac history and Diabetes. Pt is treated for HTN.  Covid test done 05/23/20 and it's negative. Pt states he's been in quarantine since the test was done and understands that he stays in quarantine until he comes to the hospital tomorrow.

## 2020-05-27 ENCOUNTER — Ambulatory Visit (HOSPITAL_COMMUNITY): Payer: PRIVATE HEALTH INSURANCE | Admitting: Certified Registered Nurse Anesthetist

## 2020-05-27 ENCOUNTER — Ambulatory Visit (HOSPITAL_COMMUNITY)
Admission: RE | Admit: 2020-05-27 | Discharge: 2020-05-27 | Disposition: A | Discharge status: Home / Self Care | Payer: PRIVATE HEALTH INSURANCE | Source: Ambulatory Visit | Attending: Emergency Medicine | Admitting: Emergency Medicine

## 2020-05-27 ENCOUNTER — Ambulatory Visit (HOSPITAL_COMMUNITY)
Admission: RE | Admit: 2020-05-27 | Discharge: 2020-05-27 | Disposition: A | Discharge status: Home / Self Care | Payer: PRIVATE HEALTH INSURANCE | Source: Ambulatory Visit | Attending: Physical Medicine and Rehabilitation | Admitting: Physical Medicine and Rehabilitation

## 2020-05-27 ENCOUNTER — Encounter (HOSPITAL_COMMUNITY): Payer: Self-pay | Admitting: Emergency Medicine

## 2020-05-27 ENCOUNTER — Other Ambulatory Visit: Payer: Self-pay

## 2020-05-27 ENCOUNTER — Encounter (HOSPITAL_COMMUNITY)
Admission: RE | Disposition: A | Discharge status: Home / Self Care | Payer: Self-pay | Source: Ambulatory Visit | Attending: Emergency Medicine

## 2020-05-27 DIAGNOSIS — X58XXXA Exposure to other specified factors, initial encounter: Secondary | ICD-10-CM | POA: Diagnosis not present

## 2020-05-27 DIAGNOSIS — Z87891 Personal history of nicotine dependence: Secondary | ICD-10-CM | POA: Insufficient documentation

## 2020-05-27 DIAGNOSIS — M545 Low back pain, unspecified: Secondary | ICD-10-CM | POA: Diagnosis not present

## 2020-05-27 DIAGNOSIS — Q7649 Other congenital malformations of spine, not associated with scoliosis: Secondary | ICD-10-CM | POA: Insufficient documentation

## 2020-05-27 DIAGNOSIS — Z91030 Bee allergy status: Secondary | ICD-10-CM | POA: Diagnosis not present

## 2020-05-27 DIAGNOSIS — M67854 Other specified disorders of tendon, left hip: Secondary | ICD-10-CM | POA: Insufficient documentation

## 2020-05-27 DIAGNOSIS — S76012A Strain of muscle, fascia and tendon of left hip, initial encounter: Secondary | ICD-10-CM | POA: Insufficient documentation

## 2020-05-27 DIAGNOSIS — M25552 Pain in left hip: Secondary | ICD-10-CM | POA: Diagnosis not present

## 2020-05-27 DIAGNOSIS — Z79899 Other long term (current) drug therapy: Secondary | ICD-10-CM | POA: Insufficient documentation

## 2020-05-27 DIAGNOSIS — Z79891 Long term (current) use of opiate analgesic: Secondary | ICD-10-CM | POA: Diagnosis not present

## 2020-05-27 DIAGNOSIS — M5126 Other intervertebral disc displacement, lumbar region: Secondary | ICD-10-CM | POA: Insufficient documentation

## 2020-05-27 DIAGNOSIS — Z880 Allergy status to penicillin: Secondary | ICD-10-CM | POA: Diagnosis not present

## 2020-05-27 HISTORY — PX: RADIOLOGY WITH ANESTHESIA: SHX6223

## 2020-05-27 SURGERY — MRI WITH ANESTHESIA
Anesthesia: General

## 2020-05-27 MED ORDER — LACTATED RINGERS IV SOLN: INTRAVENOUS | Status: DC | PRN

## 2020-05-27 MED ORDER — SUCCINYLCHOLINE CHLORIDE 200 MG/10ML IV SOSY
PREFILLED_SYRINGE | INTRAVENOUS | Status: DC | PRN
  Administered 2020-05-27: 120 mg via INTRAVENOUS

## 2020-05-27 MED ORDER — MIDAZOLAM HCL 2 MG/2ML IJ SOLN
INTRAMUSCULAR | Status: DC | PRN
  Administered 2020-05-27: 2 mg via INTRAVENOUS

## 2020-05-27 MED ORDER — DEXMEDETOMIDINE (PRECEDEX) IN NS 20 MCG/5ML (4 MCG/ML) IV SYRINGE
PREFILLED_SYRINGE | INTRAVENOUS | Status: DC | PRN
  Administered 2020-05-27: 20 ug via INTRAVENOUS

## 2020-05-27 MED ORDER — FENTANYL CITRATE (PF) 250 MCG/5ML IJ SOLN
INTRAMUSCULAR | Status: DC | PRN
  Administered 2020-05-27: 100 ug via INTRAVENOUS

## 2020-05-27 MED ORDER — LIDOCAINE 2% (20 MG/ML) 5 ML SYRINGE
INTRAMUSCULAR | Status: DC | PRN
  Administered 2020-05-27: 60 mg via INTRAVENOUS

## 2020-05-27 MED ORDER — PROPOFOL 10 MG/ML IV BOLUS
INTRAVENOUS | Status: DC | PRN
  Administered 2020-05-27: 200 mg via INTRAVENOUS

## 2020-05-27 MED ORDER — PROMETHAZINE HCL 25 MG/ML IJ SOLN
INTRAMUSCULAR | Status: AC
  Administered 2020-05-27: 12.5 mg via INTRAVENOUS
  Filled 2020-05-27: qty 1

## 2020-05-27 NOTE — Progress Notes (Signed)
Spoke to Dr. Ermalene Postin regarding continued nausea and vomiting right after taking his pills this morning.  Dr. Ermalene Postin stated he would get back to me.  Patient requesting med for nausea.

## 2020-05-27 NOTE — Transfer of Care (Signed)
Immediate Anesthesia Transfer of Care Note  Patient: Trevontae Lindahl  Procedure(s) Performed: MRI WITH ANESTHESIA LUMBAR WITHOUT CONTRAST AND LEFT HIP WITHOUT CONTRAST (N/A )  Patient Location: PACU  Anesthesia Type:General  Level of Consciousness: drowsy and patient cooperative  Airway & Oxygen Therapy: Patient Spontanous Breathing  Post-op Assessment: Report given to RN, Post -op Vital signs reviewed and stable and Patient moving all extremities X 4  Post vital signs: Reviewed and stable  Last Vitals:  Vitals Value Taken Time  BP 155/94 05/27/20 1139  Temp    Pulse 73 05/27/20 1141  Resp 14 05/27/20 1141  SpO2 98 % 05/27/20 1141  Vitals shown include unvalidated device data.  Last Pain:  Vitals:   05/27/20 0812  TempSrc:   PainSc: 6          Complications: No complications documented.

## 2020-05-27 NOTE — Anesthesia Procedure Notes (Signed)
Procedure Name: Intubation Date/Time: 05/27/2020 10:22 AM Performed by: Darletta Moll, CRNA Pre-anesthesia Checklist: Patient identified, Emergency Drugs available, Suction available and Patient being monitored Patient Re-evaluated:Patient Re-evaluated prior to induction Oxygen Delivery Method: Circle system utilized Preoxygenation: Pre-oxygenation with 100% oxygen Induction Type: IV induction, Rapid sequence and Cricoid Pressure applied Ventilation: Mask ventilation without difficulty Laryngoscope Size: Mac and 4 Grade View: Grade I Tube type: Oral Tube size: 7.5 mm Number of attempts: 1 Airway Equipment and Method: Stylet and Oral airway Placement Confirmation: ETT inserted through vocal cords under direct vision,  positive ETCO2 and breath sounds checked- equal and bilateral Secured at: 22 cm Tube secured with: Tape Dental Injury: Teeth and Oropharynx as per pre-operative assessment

## 2020-05-27 NOTE — Anesthesia Preprocedure Evaluation (Signed)
Anesthesia Evaluation  Patient identified by MRN, date of birth, ID band Patient awake    Reviewed: Allergy & Precautions, NPO status , Patient's Chart, lab work & pertinent test results  History of Anesthesia Complications Negative for: history of anesthetic complications  Airway Mallampati: II  TM Distance: >3 FB Neck ROM: Full    Dental  (+) Poor Dentition, Missing, Chipped,    Pulmonary neg shortness of breath, neg sleep apnea, neg COPD, neg recent URI, former smoker,  Covid-19 Nucleic Acid Test Results Lab Results      Component                Value               Date                      SARSCOV2NAA              NEGATIVE            05/23/2020                [place]              NEGATIVE            01/14/2020                [place]              NEGATIVE            08/05/2019              breath sounds clear to auscultation       Cardiovascular hypertension,  Rhythm:Regular     Neuro/Psych Seizures -,  PSYCHIATRIC DISORDERS Anxiety Depression Bipolar Disorder  Neuromuscular disease    GI/Hepatic PUD, GERD  ,(+)     substance abuse  , Hepatitis -, C  Endo/Other  negative endocrine ROS  Renal/GU negative Renal ROS     Musculoskeletal negative musculoskeletal ROS (+)   Abdominal   Peds  Hematology negative hematology ROS (+)   Anesthesia Other Findings   Reproductive/Obstetrics                             Anesthesia Physical Anesthesia Plan  ASA: II  Anesthesia Plan: General   Post-op Pain Management:    Induction: Intravenous, Rapid sequence and Cricoid pressure planned  PONV Risk Score and Plan: 2 and Ondansetron and Dexamethasone  Airway Management Planned: Oral ETT  Additional Equipment: None  Intra-op Plan:   Post-operative Plan: Extubation in OR  Informed Consent: I have reviewed the patients History and Physical, chart, labs and discussed the procedure  including the risks, benefits and alternatives for the proposed anesthesia with the patient or authorized representative who has indicated his/her understanding and acceptance.     Dental advisory given  Plan Discussed with: CRNA  Anesthesia Plan Comments:         Anesthesia Quick Evaluation

## 2020-05-28 ENCOUNTER — Encounter (HOSPITAL_COMMUNITY): Payer: Self-pay | Admitting: Radiology

## 2020-05-30 NOTE — Anesthesia Postprocedure Evaluation (Signed)
Anesthesia Post Note  Patient: Walter Kemp  Procedure(s) Performed: MRI WITH ANESTHESIA LUMBAR WITHOUT CONTRAST AND LEFT HIP WITHOUT CONTRAST (N/A )     Patient location during evaluation: PACU Anesthesia Type: General Level of consciousness: awake and alert Pain management: pain level controlled Vital Signs Assessment: post-procedure vital signs reviewed and stable Respiratory status: spontaneous breathing, nonlabored ventilation, respiratory function stable and patient connected to nasal cannula oxygen Cardiovascular status: blood pressure returned to baseline and stable Postop Assessment: no apparent nausea or vomiting Anesthetic complications: no   No complications documented.  Last Vitals:  Vitals:   05/27/20 1223 05/27/20 1233  BP: 131/87 139/89  Pulse: (!) 57 (!) 58  Resp: 12 12  Temp:  36.9 C  SpO2: 98% 99%    Last Pain:  Vitals:   05/27/20 1233  TempSrc:   PainSc: 0-No pain                 Chae Shuster

## 2021-01-14 ENCOUNTER — Other Ambulatory Visit: Payer: Self-pay | Admitting: Physical Medicine and Rehabilitation

## 2021-01-14 ENCOUNTER — Other Ambulatory Visit (HOSPITAL_COMMUNITY): Payer: Self-pay | Admitting: Physical Medicine and Rehabilitation

## 2021-01-14 DIAGNOSIS — M5459 Other low back pain: Secondary | ICD-10-CM

## 2021-01-28 ENCOUNTER — Other Ambulatory Visit (HOSPITAL_COMMUNITY): Payer: Self-pay | Admitting: Physical Medicine and Rehabilitation

## 2021-01-28 DIAGNOSIS — M5459 Other low back pain: Secondary | ICD-10-CM

## 2021-02-10 NOTE — Anesthesia Preprocedure Evaluation (Addendum)
Anesthesia Evaluation  Patient identified by MRN, date of birth, ID band Patient awake    Reviewed: Allergy & Precautions, NPO status , Patient's Chart, lab work & pertinent test results  History of Anesthesia Complications Negative for: history of anesthetic complications  Airway Mallampati: II  TM Distance: >3 FB Neck ROM: Full    Dental  (+) Poor Dentition, Missing, Chipped, Dental Advisory Given   Pulmonary neg pulmonary ROS, Patient abstained from smoking., former smoker,    Pulmonary exam normal        Cardiovascular hypertension, negative cardio ROS Normal cardiovascular exam     Neuro/Psych Seizures -,  PSYCHIATRIC DISORDERS Anxiety Depression Bipolar Disorder    GI/Hepatic PUD, GERD  ,(+)     substance abuse  IV drug use, Hepatitis -, C  Endo/Other  negative endocrine ROS  Renal/GU negative Renal ROS  negative genitourinary   Musculoskeletal  (+) narcotic dependentOn suboxone   Abdominal   Peds  Hematology negative hematology ROS (+)   Anesthesia Other Findings   Reproductive/Obstetrics                           Anesthesia Physical Anesthesia Plan  ASA: 2  Anesthesia Plan: General   Post-op Pain Management: Minimal or no pain anticipated   Induction: Intravenous  PONV Risk Score and Plan: 2 and Ondansetron, Dexamethasone, Midazolam and Treatment may vary due to age or medical condition  Airway Management Planned: LMA and Oral ETT  Additional Equipment: None  Intra-op Plan:   Post-operative Plan: Extubation in OR  Informed Consent: I have reviewed the patients History and Physical, chart, labs and discussed the procedure including the risks, benefits and alternatives for the proposed anesthesia with the patient or authorized representative who has indicated his/her understanding and acceptance.     Dental advisory given  Plan Discussed with:   Anesthesia Plan  Comments: (PAT note written 02/10/2021 by Myra Gianotti, PA-C. )      Anesthesia Quick Evaluation

## 2021-02-10 NOTE — Progress Notes (Signed)
Anesthesia Chart Review: Walter Kemp  Case: [id number] Date/Time: 02/12/21 0745   Procedure: MRI WITH ANESTHESIA OF LUMBAR SPINE WITHOUT CONTRAST   Anesthesia type: General   Pre-op diagnosis: LOW BACK PAIN   Location: [facility] OR RADIOLOGY ROOM / [place] OR   Surgeons: Radiologist, Medication, MD       DISCUSSION: Patient is a 30 year old male scheduled for the above procedure. H&P signed by Dr. Suella Broad 01/28/21 (scanned under Media tab).   History includes former smoker (quit 04/18/17), alcoholic pancreatitis (6222), hepatitis C (2016), gastroparesis (2012), Bipolar 2, PTSD ([name]), GERD, substance abuse (previous drug abuse including heroin, fentanyl), seizures (childhood?),. Reported what sounds like a remote history of post-operative intubation for "issues breathing"-- no anesthetic complications documented 01/17/20 and  05/27/20.    He is for labs as indicated and anesthesia team evaluation prior to procedure.    VS:  BP Readings from Last 3 Encounters:  05/27/20 139/89  02/13/20 138/82  01/17/20 (!) 179/103   Pulse Readings from Last 3 Encounters:  05/27/20 (!) 58  02/13/20 (!) 57  01/17/20 76     PROVIDERS: Mindi Curling, PA-C is listed as PCP   LABS: For day of procedure as indicated.    EKG: 05/27/20: NSR   CV:  Echo 10/01/20 ([name]): The left ventricle is normal in size. There is normal left ventricular  wall thickness. The left ventricular ejection fraction is normal (60-65%).  The left ventricular diastolic function is normal. Pulmonary hypertension  is not suggested by Doppler findings. No significant valvular  abnormalities.  No old study for comparsion.   Past Medical History:  Diagnosis Date   Anxiety    Bipolar 2 disorder ([place])    w depression and PTSD.  [name].     Blood transfusion without reported diagnosis    due to injury in service   Chronic generalized abdominal pain since at least 2013   GI MD felt he has functional  abd pain.     Complication of anesthesia    "had issues breathing" "had to be intubated"  "years ago" per pt   Depression    Gastroparesis 2012   Nuc med gastric emptying study: 12% emptying at 2 hours, normal range is 45 to 79%.   GERD (gastroesophageal reflux disease)    Hepatitis    Hepatitis C   Hepatitis C antibody positive in blood 10/2014   genotype 1a.  no viral counts in care everywhere (as of search 07/2019).     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  Hospital 12/2018 for IV Fentanyl    Past Surgical History:  Procedure Laterality Date   APPENDECTOMY     COLONOSCOPY  01/15/2020   poor prep, had to repeat   COLONOSCOPY  02/13/2020   ELBOW SURGERY     right   ESOPHAGOGASTRODUODENOSCOPY  01/2016   for anemia, self reported hematemesis.  Dr Janus Molder, [facility].  entirly normal study.     ESOPHAGOGASTRODUODENOSCOPY  10/2014   for dark, tarry stools.  Dr Ezzie Dural at [place].  normal EGD.     ESOPHAGOGASTRODUODENOSCOPY  11/2013   Dr Darlis Loan at [place].  for abd pain, n/v.  gastric  scar from healed PUD, gastritis.     ESOPHAGOGASTRODUODENOSCOPY  01/2013   Dr Ezzie Dural at [place].  for abd pain.  normal study.  felt to have functional abd pain   ESOPHAGOGASTRODUODENOSCOPY (EGD) WITH PROPOFOL N/A 08/05/2019   Procedure: ESOPHAGOGASTRODUODENOSCOPY (EGD) WITH PROPOFOL;  Surgeon: Juanita Craver, MD;  Location: [facility];  Service: Endoscopy;  Laterality: N/A;   EXPLORATORY LAPAROTOMY  2012   for bleeding after lap appy.  at [place]   Dr Scherrie November at [facility].  for reported rectal bleeding.  small, non-bleeding int rrhoids.     HAND SURGERY     LAPAROSCOPIC APPENDECTOMY  2012   at [facility].  bleeding post surgery  led to ex lap a few days after appy.     RADIOLOGY WITH ANESTHESIA N/A 01/17/2020   Procedure: MRI WITH ANESTHESIA LUMBAR SPINE WITHOUT CONTRAST;  Surgeon: Radiologist, Medication, MD;  Location: [place];  Service: Radiology;  Laterality: N/A;   RADIOLOGY WITH ANESTHESIA N/A 05/27/2020   Procedure: MRI WITH ANESTHESIA LUMBAR WITHOUT CONTRAST AND LEFT HIP WITHOUT CONTRAST;  Surgeon: Radiologist, Medication, MD;  Location: [place];  Service: Radiology;  Laterality: N/A;   SHOULDER SURGERY     SHOULDER SURGERY Bilateral    UPPER GASTROINTESTINAL ENDOSCOPY      MEDICATIONS: No current facility-administered medications for this encounter.    acetaminophen (TYLENOL) 500 MG tablet   ARIPiprazole (ABILIFY) 10 MG tablet   buprenorphine-naloxone (SUBOXONE) 8-2 mg SUBL SL tablet   buPROPion (WELLBUTRIN SR) 150 MG 12 hr tablet   clonazePAM (KLONOPIN) 1 MG tablet   eszopiclone (LUNESTA) 1 MG TABS tablet   gabapentin (NEURONTIN) 800 MG tablet   ibuprofen (ADVIL) 200 MG tablet   Melatonin 10 MG TABS   NARCAN 4 MG/0.1ML LIQD nasal spray kit   pantoprazole (PROTONIX) 40 MG tablet   tiZANidine (ZANAFLEX) 4 MG tablet    Myra Gianotti, PA-C Surgical Short Stay/Anesthesiology [facility] Phone [phone] [facility] Phone [phone] 02/10/2021 6:35 PM

## 2021-02-11 ENCOUNTER — Other Ambulatory Visit: Payer: Self-pay

## 2021-02-11 ENCOUNTER — Encounter (HOSPITAL_COMMUNITY): Payer: Self-pay

## 2021-02-11 NOTE — Progress Notes (Signed)
PCP - Dr. Rogelia Boga  Cardiologist - Denies  EP- Denies  Endocrine- Denies  Pulm- Denies  Chest x-ray - Denies  EKG - 05/27/20 (E)  Stress Test - Denies  ECHO - Denies  Cardiac Cath - Denies  AICD-na PM-na LOOP-na  Dialysis- Denies  Sleep Study - Denies CPAP - Denies  LABS- 02/12/21: CMP  ASA- Denies  ERAS- No  HA1C- Denies  Anesthesia- Yes- med history  Pt denies having chest pain, sob, or fever during the pre-op phone call. All instructions explained to the pt, with a verbal understanding of the material including: as of today, stop taking all Aspirin (unless instructed by your doctor) and Other Aspirin containing products, Vitamins, Fish oils, and Herbal medications. Also stop all NSAIDS i.e. Advil, Ibuprofen, Motrin, Aleve, Anaprox, Naproxen, BC, Goody Powders, and all Supplements.  Pt also instructed to wear a mask and social distance if he goes out. The opportunity to ask questions was provided.    Coronavirus Screening  Have you experienced the following symptoms:  Cough yes/no: No Fever (>100.4F)  yes/no: No Runny nose yes/no: No Sore throat yes/no: No Difficulty breathing/shortness of breath  yes/no: No  Have you or a family member traveled in the last 14 days and where? yes/no: No   If the patient indicates "YES" to the above questions, their PAT will be rescheduled to limit the exposure to others and, the surgeon will be notified. THE PATIENT WILL NEED TO BE ASYMPTOMATIC FOR 14 DAYS.   If the patient is not experiencing any of these symptoms, the PAT nurse will instruct them to NOT bring anyone with them to their appointment since they may have these symptoms or traveled as well.   Please remind your patients and families that hospital visitation restrictions are in effect and the importance of the restrictions.

## 2021-02-12 ENCOUNTER — Ambulatory Visit (HOSPITAL_COMMUNITY)
Admission: RE | Admit: 2021-02-12 | Discharge: 2021-02-12 | Disposition: A | Discharge status: Home / Self Care | Payer: Worker's Compensation | Source: Ambulatory Visit | Attending: Physical Medicine and Rehabilitation | Admitting: Physical Medicine and Rehabilitation

## 2021-02-12 ENCOUNTER — Ambulatory Visit (HOSPITAL_COMMUNITY)
Admission: RE | Admit: 2021-02-12 | Discharge: 2021-02-12 | Disposition: A | Discharge status: Home / Self Care | Payer: Worker's Compensation | Attending: Physical Medicine and Rehabilitation | Admitting: Physical Medicine and Rehabilitation

## 2021-02-12 ENCOUNTER — Ambulatory Visit (HOSPITAL_COMMUNITY): Payer: Worker's Compensation | Admitting: Vascular Surgery

## 2021-02-12 ENCOUNTER — Other Ambulatory Visit: Payer: Self-pay

## 2021-02-12 ENCOUNTER — Encounter (HOSPITAL_COMMUNITY): Payer: Self-pay

## 2021-02-12 ENCOUNTER — Encounter (HOSPITAL_COMMUNITY)
Admission: RE | Disposition: A | Discharge status: Home / Self Care | Payer: Self-pay | Source: Home / Self Care | Attending: Physical Medicine and Rehabilitation

## 2021-02-12 DIAGNOSIS — G8929 Other chronic pain: Secondary | ICD-10-CM | POA: Diagnosis not present

## 2021-02-12 DIAGNOSIS — M5126 Other intervertebral disc displacement, lumbar region: Secondary | ICD-10-CM | POA: Insufficient documentation

## 2021-02-12 DIAGNOSIS — M5459 Other low back pain: Secondary | ICD-10-CM | POA: Diagnosis present

## 2021-02-12 HISTORY — PX: RADIOLOGY WITH ANESTHESIA: SHX6223

## 2021-02-12 SURGERY — MRI WITH ANESTHESIA
Anesthesia: General

## 2021-02-12 MED ORDER — LACTATED RINGERS IV SOLN: INTRAVENOUS | Status: DC | PRN

## 2021-02-12 MED ORDER — ROCURONIUM BROMIDE 10 MG/ML (PF) SYRINGE
PREFILLED_SYRINGE | INTRAVENOUS | Status: DC | PRN
  Administered 2021-02-12: 50 mg via INTRAVENOUS

## 2021-02-12 MED ORDER — ORAL CARE MOUTH RINSE: 15.0000 mL | Freq: Once | OROMUCOSAL | Status: AC

## 2021-02-12 MED ORDER — DEXAMETHASONE SODIUM PHOSPHATE 10 MG/ML IJ SOLN
INTRAMUSCULAR | Status: DC | PRN
  Administered 2021-02-12: 5 mg via INTRAVENOUS

## 2021-02-12 MED ORDER — PROPOFOL 10 MG/ML IV BOLUS
INTRAVENOUS | Status: DC | PRN
  Administered 2021-02-12: 200 mg via INTRAVENOUS

## 2021-02-12 MED ORDER — LIDOCAINE 2% (20 MG/ML) 5 ML SYRINGE
INTRAMUSCULAR | Status: DC | PRN
  Administered 2021-02-12: 100 mg via INTRAVENOUS

## 2021-02-12 MED ORDER — FENTANYL CITRATE (PF) 250 MCG/5ML IJ SOLN
INTRAMUSCULAR | Status: DC | PRN
  Administered 2021-02-12: 100 ug via INTRAVENOUS

## 2021-02-12 MED ORDER — MIDAZOLAM HCL 2 MG/2ML IJ SOLN
INTRAMUSCULAR | Status: DC | PRN
  Administered 2021-02-12 (×2): 2 mg via INTRAVENOUS

## 2021-02-12 MED ORDER — CHLORHEXIDINE GLUCONATE 0.12 % MT SOLN
15.0000 mL | Freq: Once | OROMUCOSAL | Status: AC
  Administered 2021-02-12: 07:00:00 15 mL via OROMUCOSAL
  Filled 2021-02-12: qty 15

## 2021-02-12 MED ORDER — SUGAMMADEX SODIUM 200 MG/2ML IV SOLN
INTRAVENOUS | Status: DC | PRN
  Administered 2021-02-12: 300 mg via INTRAVENOUS

## 2021-02-12 MED ORDER — LACTATED RINGERS IV SOLN: INTRAVENOUS | Status: DC

## 2021-02-12 NOTE — Transfer of Care (Signed)
Immediate Anesthesia Transfer of Care Note  Patient: Walter Kemp  Procedure(s) Performed: MRI WITH ANESTHESIA OF LUMBAR SPINE WITHOUT CONTRAST  Patient Location: PACU  Anesthesia Type:General  Level of Consciousness: awake, alert  and oriented  Airway & Oxygen Therapy: Patient Spontanous Breathing  Post-op Assessment: Report given to RN, Post -op Vital signs reviewed and stable and Patient moving all extremities X 4  Post vital signs: Reviewed and stable  Last Vitals:  Vitals Value Taken Time  BP 144/102 02/12/21 0933  Temp    Pulse 68 02/12/21 0934  Resp 12 02/12/21 0934  SpO2 95 % 02/12/21 0934  Vitals shown include unvalidated device data.  Last Pain:  Vitals:   02/12/21 0646  TempSrc:   PainSc: 6          Complications: No notable events documented.

## 2021-02-12 NOTE — Progress Notes (Signed)
Per Dr. Christella Hartigan, no labs needed.

## 2021-02-12 NOTE — Anesthesia Postprocedure Evaluation (Signed)
Anesthesia Post Note  Patient: Walter Kemp  Procedure(s) Performed: MRI WITH ANESTHESIA OF LUMBAR SPINE WITHOUT CONTRAST     Patient location during evaluation: PACU Anesthesia Type: General Level of consciousness: awake and alert Pain management: pain level controlled Vital Signs Assessment: post-procedure vital signs reviewed and stable Respiratory status: spontaneous breathing, nonlabored ventilation and respiratory function stable Cardiovascular status: blood pressure returned to baseline and stable Postop Assessment: no apparent nausea or vomiting Anesthetic complications: no   No notable events documented.  Last Vitals:  Vitals:   02/12/21 0948 02/12/21 1003  BP: (!) 158/105 (!) 159/106  Pulse: 69 64  Resp: 13 12  Temp:  36.6 C  SpO2: 100% 100%    Last Pain:  Vitals:   02/12/21 1003  TempSrc:   PainSc: 2                  Lidia Collum

## 2021-02-12 NOTE — Anesthesia Procedure Notes (Signed)
Procedure Name: Intubation Date/Time: 02/12/2021 8:34 AM Performed by: Gaylene Brooks, CRNA Pre-anesthesia Checklist: Patient identified, Emergency Drugs available, Suction available and Patient being monitored Patient Re-evaluated:Patient Re-evaluated prior to induction Oxygen Delivery Method: Circle System Utilized Preoxygenation: Pre-oxygenation with 100% oxygen Induction Type: IV induction Ventilation: Mask ventilation without difficulty Laryngoscope Size: Miller and 2 Grade View: Grade I Tube type: Oral Tube size: 7.5 mm Number of attempts: 1 Airway Equipment and Method: Stylet and Oral airway Placement Confirmation: ETT inserted through vocal cords under direct vision, positive ETCO2 and breath sounds checked- equal and bilateral Secured at: 22 cm Tube secured with: Tape Dental Injury: Teeth and Oropharynx as per pre-operative assessment

## 2021-11-23 ENCOUNTER — Ambulatory Visit (HOSPITAL_COMMUNITY)
Admission: EM | Admit: 2021-11-23 | Discharge: 2021-11-23 | Disposition: A | Discharge status: Home / Self Care | Payer: PRIVATE HEALTH INSURANCE

## 2021-11-23 DIAGNOSIS — F1910 Other psychoactive substance abuse, uncomplicated: Secondary | ICD-10-CM | POA: Diagnosis not present

## 2021-11-23 NOTE — ED Notes (Signed)
Printed AVS reviewed and provided to patient along with resources by provider. Patient discharged and walked out by provider. No s/s of current distress.

## 2021-11-23 NOTE — ED Notes (Signed)
Patient discharged by provider Carolyn Coleman, NP with written and verbal instructions. 

## 2021-11-23 NOTE — Discharge Instructions (Addendum)
Please contact one of the following facilities to start medication management and therapy services:   [facility]. ([place])  [place], [place]  [address] Phone: [phone]  [facility]  [address], [place] [address] Phone: [phone]  [facility]  [address]  [place], [place] [address]  [facility]  [address]  [place], [place] [address] Phone: [phone]

## 2021-11-23 NOTE — ED Provider Notes (Signed)
Behavioral Health Urgent Care Medical Screening Exam  Patient Name: Walter Kemp MRN: [id number] Date of Evaluation: 11/23/21 Chief Complaint:   Diagnosis:  Final diagnoses:  Polysubstance abuse ([place])    History of Present illness: Walter Kemp is a 31 y.o. male Walter Kemp 31 y.o., male patient presented to [facility] as a walk in voluntarily accompanied by his girlfriend's father at the recommendation of [facility].  Walter Kemp, 31 y.o., male patient seen face to face by this provider, consulted with Dr. Dwyane Dee; and chart reviewed on 11/23/21.  Chart review patient has a past psychiatric history of psychophysiological insomnia, GAD, and history of narcotic addiction.  Reports he works full-time.  He lives with his girlfriend.  Reports that he has 2 upcoming [address] appointments.  One that  that is in a week and a half that cannot be postponed for driving without a license.  He also has another appointment on 10/28 that is for substance abuse court.  States it is a court ordered appointment where he will have to do outpatient therapy.  He is presenting today requesting detox at the recommendation of [facility].  However he is adamant that he does not want to be admitted today.  He is also concerned that his detox would not be completed with Suboxone.  He is using roughly 1 g of heroin that he injects daily, cocaine 1 g he snorts daily, and benzodiazepines that he uses roughly 10 mg/day.  Reports he had been sober for 2.5 years, but he relapsed 4 days ago.  His last use was this morning.  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  His right hand is swollen, reddened, and painful.  Reports he presented to the ED yesterday in [place] in the MD encouraged him to be admitted to be treated for his hand.  He refused and signed out  AMA at that time.  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  Discussed the dangers of hand not being treated and offered to have patient escorted to the emergency department for treatment and he adamantly refused.  Discussed if he does not get a treatment his condition could worsen and could possibly become septic and cause death.  He continued to refuse.  He does agree to present on his own after discharge from [facility].  He also has a 1-2 inch scab on his right antecubital area.  His girlfriend's father is accompanying him and he verbalizes that he will ensure that patient presents to the emergency department for treatment.  During evaluation Walter Kemp is observed walking around the assessment area.  He does not appear to be in any acute distress.  He is disheveled and makes fair eye contact.    He is alert/oriented x4, cooperative, and inattentive at times, his eye droop and his head nods at times.His speech is slurred. However, he is able to be redirected and able to answer questions appropriately.  He denies any concerns with appetite.  Reports he sleeps "okay".  He denies SI/HI/AVH.  Objectively he does not appear to be responding to internal/external stimuli.  Discussed admission to the facility based crisis unit for benzo/heroin detox after having patient transported to the emergency department to have his hand evaluated..  Patient  declines being transported to the emergency department and he declines admission to the [facility] at this time.  States he will present to the emergency department have his hand evaluated and consider admission to the [facility].  Psychiatric Specialty Exam  Presentation  General Appearance:Disheveled  Eye Contact:Fair  Speech:Clear and Coherent; Normal Rate  Speech Volume:Normal  Handedness:Right   Mood and Affect  Mood: Anxious  Affect: Congruent   Thought Process  Thought  Processes: Coherent  Descriptions of Associations:Intact  Orientation:Full (Time, Place and Person)  Thought Content:Logical    Hallucinations:None  Ideas of Reference:None  Suicidal Thoughts:No  Homicidal Thoughts:No   Sensorium  Memory: Immediate Good; Recent Good; Remote Good  Judgment: Poor  Insight: Poor   Executive Functions  Concentration: Fair  Attention Span: Fair  Recall: Good; [place] of Knowledge: Good  Language: Good   Psychomotor Activity  Psychomotor Activity: Normal   Assets  Assets: Communication Skills; Desire for Improvement; Housing; Leisure Time; Physical Health; Vocational/Educational   Sleep  Sleep: [place]  Number of hours: No data recorded  No data recorded  Physical Exam: Physical Exam Vitals and nursing note reviewed.  Constitutional:      General: He is not in acute distress.    Appearance: He is well-developed.  HENT:     Head: Normocephalic and atraumatic.  Eyes:     General:        Right eye: No discharge.        Left eye: No discharge.     Conjunctiva/sclera: Conjunctivae normal.  Cardiovascular:     Rate and Rhythm: Normal rate.  Pulmonary:     Effort: Pulmonary effort is normal. No respiratory distress.  Musculoskeletal:        General: Normal range of motion.     Cervical back: Normal range of motion.  Skin:    General: Skin is warm.     Coloration: Skin is not jaundiced or pale.          Comments: Reddened, swollen, and painful. He is unable to close his hands. His finger joints are also swollen.   Also has a scabbed areas roughly 1-2 inches long inner ante cubital area.   Neurological:     Mental Status: He is alert and oriented to person, place, and time.  Psychiatric:        Attention and Perception: He is inattentive.        Mood and Affect: Mood is anxious.        Speech: Speech is slurred.        Behavior: Behavior is cooperative.        Thought Content: Thought content normal.         Cognition and Memory: Cognition normal.        Judgment: Judgment is impulsive.    Review of Systems  Constitutional: Negative.   HENT: Negative.    Eyes: Negative.   Respiratory: Negative.    Cardiovascular: Negative.   Musculoskeletal: Negative.   Skin: Negative.   Neurological: Negative.   Psychiatric/Behavioral:  Positive for substance abuse. The patient is nervous/anxious.    Blood pressure (!) 149/88, pulse (!) 112, temperature 99.6 F (37.6 C), temperature source Oral, resp. rate 20, height 6' (1.829 m), weight 215 lb (97.5 kg), SpO2 98 %. Body mass index is 29.16 kg/m.  Musculoskeletal: Strength & Muscle Tone: within normal limits Gait & Station: normal Patient leans: N/A   [facility] Discharge Disposition for Follow up and Recommendations: Based on my  evaluation the patient appears to have an emergency medical condition for which I recommend the patient be transferred to the emergency department for further evaluation.   Discharge patient.  Patient refused to be transferred to the emergency department.  Discussed with patient that his right hand needs to be evaluated ASAP.  Explained the dangers of not obtaining treatment that can include and sepsis/death.  Patient verbalized understanding and requested to be discharged and agrees that he will present to the emergency department on his own.  No evidence of imminent risk to self or others at present.    Patient does not meet criteria for psychiatric inpatient admission. Discussed crisis plan, support from social network, calling 911, coming to the Emergency Department, and calling Suicide Hotline.   Revonda Humphrey, NP 11/23/2021, 4:42 PM

## 2021-11-23 NOTE — BH Assessment (Signed)
Pt requesting detox referred by [facility]. Denies SI, HI, AVH.

## 2021-12-03 ENCOUNTER — Ambulatory Visit (HOSPITAL_COMMUNITY)
Admission: EM | Admit: 2021-12-03 | Discharge: 2021-12-03 | Disposition: A | Discharge status: Home / Self Care | Payer: PRIVATE HEALTH INSURANCE

## 2021-12-03 DIAGNOSIS — F1990 Other psychoactive substance use, unspecified, uncomplicated: Secondary | ICD-10-CM

## 2021-12-03 NOTE — Discharge Instructions (Addendum)

## 2021-12-03 NOTE — Progress Notes (Signed)
Patient states that he had been seen here last week seeking addiction treatment, but had an open wound that turned out to be cellultis and he was hospitalized for six days and discharged.  States that he wants drug treatment.  Patient has a history of cocaine and heroin use IV, but has not had any in six days.  Patient states that he is prescribed Klonopin, Ambien and taking Tramodol for pain.  Patient denies SI/HI/Psychosis.  States that his sleep is horrible, but his appetite is great.  Patient is routine.

## 2021-12-03 NOTE — ED Provider Notes (Signed)
Behavioral Health Urgent Care Medical Screening Exam  Patient Name: Walter Kemp MRN: [id number] Date of Evaluation: 12/03/21 Chief Complaint:   Diagnosis:  Final diagnoses:  Polysubstance use disorder    History of Present illness: Walter Kemp is a 31 y.o. male. Patient presents voluntarily to [facility] behavioral health for walk-in assessment.  Patient is assessed, face-to-face, by nurse practitioner.  He is alert and oriented, pleasant and cooperative.  He is seated in assessment area, no apparent distress.  Patient presents with euthymic mood, congruent affect.  Walter Kemp is seeking residential substance use treatment today.  He denies all alcohol and substance use for the past 7 days.  He has been admitted to a medical unit related to cellulitis to his right hand for the past 6 days.He is compliant with Zyvox every 12 hours as directed upon discharge from hospital for treatment of cellulitis to right hand.  Right hand visualized by this writer, appears intact.    He reports history of use of heroin, IV route, daily.  Additional substance use includes cocaine, opioid medications and benzodiazepine medications.  Patient denies symptoms of withdrawal at this time.  He would like to be admitted to residential substance use treatment but would prefer to commit to only a 14-day stay.  Patient has an upcoming court date on 12/18/2021.  Plans to attend court date if possible.  [name] reports history of major depressive disorder.  He is not linked with outpatient psychiatry currently.  He is compliant with medications, medications prescribed by primary care provider.  He denies history of inpatient psychiatric hospitalization.  No family mental health history reported.  He denies suicidal and homicidal ideation.  He easily contracts verbally for safety with this Probation officer.  He denies auditory and visual hallucinations.  There is no evidence of delusional thought content and no indication that  patient is responding to internal stimuli.  He denies symptoms of paranoia.  Patient resides in [place] with his fiance and 63-year-old daughter.  He denies access to weapons.  He is employed in the [employer] however recently quit his job because he did not want to be fired when his employer found out he had relapsed on substance.  He endorses average sleep and appetite.  Patient offered support and encouragement.  Offered facility based crisis unit admission, patient declines.  He would like to follow-up with substance use treatment resources provided.  He gives verbal consent to speak with his fiance, McKenzie.  Patient's fianc would prefer he be admitted for residential substance use treatment as soon as possible.   Patient and family are educated and verbalize understanding of mental health resources and other crisis services in the community. They are instructed to call 911 and present to the nearest emergency room should patient experience any suicidal/homicidal ideation, auditory/visual/hallucinations, or detrimental worsening of mental health condition.     Psychiatric Specialty Exam  Presentation  General Appearance:Appropriate for Environment; Casual  Eye Contact:Good  Speech:Clear and Coherent; Normal Rate  Speech Volume:Normal  Handedness:Right   Mood and Affect  Mood: Euthymic  Affect: Appropriate; Congruent   Thought Process  Thought Processes: Coherent; Goal Directed; Linear  Descriptions of Associations:Intact  Orientation:Full (Time, Place and Person)  Thought Content:Logical; WDL    Hallucinations:None  Ideas of Reference:None  Suicidal Thoughts:No  Homicidal Thoughts:No   Sensorium  Memory: Immediate Good; Recent Good  Judgment: Fair  Insight: Fair   Executive Functions  Concentration: Good  Attention Span: Good  Recall: Good  Fund of Knowledge:  Good  Language: Good   Psychomotor Activity  Psychomotor  Activity: Normal   Assets  Assets: Communication Skills; Desire for Improvement; Financial Resources/Insurance; Housing; Leisure Time; Resilience; Social Support   Sleep  Sleep: Fair  Number of hours: No data recorded  Nutritional Assessment (For OBS and FBC admissions only) Has the patient had a weight loss or gain of 10 pounds or more in the last 3 months?: No Has the patient had a decrease in food intake/or appetite?: No Does the patient have dental problems?: No Does the patient have eating habits or behaviors that may be indicators of an eating disorder including binging or inducing vomiting?: No Has the patient recently lost weight without trying?: 0 Has the patient been eating poorly because of a decreased appetite?: 0 Malnutrition Screening Tool Score: 0    Physical Exam: Physical Exam Vitals and nursing note reviewed.  Constitutional:      Appearance: Normal appearance. He is well-developed and normal weight.  HENT:     Head: Normocephalic and atraumatic.     Nose: Nose normal.  Cardiovascular:     Rate and Rhythm: Normal rate and regular rhythm.  Pulmonary:     Effort: Pulmonary effort is normal.     Breath sounds: Normal breath sounds.  Musculoskeletal:        General: Normal range of motion.  Skin:    General: Skin is warm and dry.  Neurological:     Mental Status: He is alert and oriented to person, place, and time.  Psychiatric:        Attention and Perception: Attention and perception normal.        Mood and Affect: Mood and affect normal.        Speech: Speech normal.        Behavior: Behavior normal. Behavior is cooperative.        Thought Content: Thought content normal.        Cognition and Memory: Cognition and memory normal.        Judgment: Judgment normal.    Review of Systems  Constitutional: Negative.   HENT: Negative.    Eyes: Negative.   Respiratory: Negative.    Cardiovascular: Negative.   Gastrointestinal: Negative.    Genitourinary: Negative.   Musculoskeletal: Negative.   Skin: Negative.   Neurological: Negative.   Psychiatric/Behavioral:  Positive for substance abuse.    Blood pressure (!) 150/90, pulse 100, temperature 98.6 F (37 C), temperature source Oral, resp. rate 18, SpO2 98 %. There is no height or weight on file to calculate BMI.  Musculoskeletal: Strength & Muscle Tone: within normal limits Gait & Station: normal Patient leans: N/A   [facility] MSE Discharge Disposition for Follow up and Recommendations: Based on my evaluation the patient does not appear to have an emergency medical condition and can be discharged with resources and follow up care in outpatient services for Medication Management, Substance Abuse Intensive Outpatient Program, and Individual Therapy Follow-up with outpatient psychiatry resources provided. Follow-up with substance use treatment resources provided.  Lucky Rathke, FNP 12/03/2021, 6:42 PM

## 2021-12-03 NOTE — Progress Notes (Signed)
[facility] called multiple residential treatment facilities for the client.  Writer was able to make contact with [facility] but they do not accept his insurance.  Writer called [facility] they are currently full but they may have an opening tomorrow. Writer called [facility] but they were closed.  Writer also called [facility] but they do not accept his insurance.   Inside of the AVS the Writer listed multiple agencies he can follow up with tomorrow to check on openings and if they accept his insurance.    Drema Balzarine  [facility]

## 2022-02-25 ENCOUNTER — Emergency Department (HOSPITAL_BASED_OUTPATIENT_CLINIC_OR_DEPARTMENT_OTHER): Payer: Self-pay

## 2022-02-25 ENCOUNTER — Emergency Department (HOSPITAL_BASED_OUTPATIENT_CLINIC_OR_DEPARTMENT_OTHER)
Admission: EM | Admit: 2022-02-25 | Discharge: 2022-02-25 | Discharge status: Against Medical Advice | Payer: Self-pay | Attending: Emergency Medicine | Admitting: Emergency Medicine

## 2022-02-25 ENCOUNTER — Encounter (HOSPITAL_BASED_OUTPATIENT_CLINIC_OR_DEPARTMENT_OTHER): Payer: Self-pay

## 2022-02-25 DIAGNOSIS — R110 Nausea: Secondary | ICD-10-CM | POA: Insufficient documentation

## 2022-02-25 DIAGNOSIS — S40022A Contusion of left upper arm, initial encounter: Secondary | ICD-10-CM | POA: Insufficient documentation

## 2022-02-25 DIAGNOSIS — S60221A Contusion of right hand, initial encounter: Secondary | ICD-10-CM | POA: Insufficient documentation

## 2022-02-25 DIAGNOSIS — S60222A Contusion of left hand, initial encounter: Secondary | ICD-10-CM | POA: Insufficient documentation

## 2022-02-25 DIAGNOSIS — W208XXA Other cause of strike by thrown, projected or falling object, initial encounter: Secondary | ICD-10-CM | POA: Insufficient documentation

## 2022-02-25 DIAGNOSIS — S39012A Strain of muscle, fascia and tendon of lower back, initial encounter: Secondary | ICD-10-CM | POA: Insufficient documentation

## 2022-02-25 DIAGNOSIS — S40021A Contusion of right upper arm, initial encounter: Secondary | ICD-10-CM | POA: Insufficient documentation

## 2022-02-25 DIAGNOSIS — R000 Tachycardia, unspecified: Secondary | ICD-10-CM | POA: Insufficient documentation

## 2022-02-25 MED ORDER — OXYCODONE-ACETAMINOPHEN 5-325 MG PO TABS
2.0000 | ORAL_TABLET | Freq: Once | ORAL | Status: DC
  Filled 2022-02-25: qty 2

## 2022-02-25 NOTE — ED Notes (Signed)
ED Provider at bedside. 

## 2022-02-25 NOTE — ED Triage Notes (Addendum)
Pt ambulatory to room. Presents with bilateral arm pain, swelling and bruising due to dropping trailer on bilateral arms. Right hand swollen and bruised Swelling noted to upper and lower arms  incident occurred 2 days ago last took ibuprofen 3 am

## 2022-02-25 NOTE — ED Notes (Signed)
Pt refusing to go to XR until he speaks with EDP

## 2022-02-25 NOTE — ED Provider Notes (Addendum)
[place] EMERGENCY DEPARTMENT Provider Note   CSN: [id number] Arrival date & time: 02/25/22  2330     History  Chief Complaint  Patient presents with   Arm Injury    Walter Kemp is a 32 y.o. male.  Patient presents with bilateral arm pain and bruising since having the hitch of the trailer drop on his arms a day and a half ago.  Patient is not on blood thinners.  Patient has history of polysubstance abuse currently in treatment and says he has not used in over 3 months, patient does have a coach he follows with.  Patient has pain with movement bilateral arms.  Mild nausea.  Difficulty to sleep due to pain.  Patient had multiple injuries in the past musculoskeletal.       Home Medications Prior to Admission medications   Medication Sig Start Date End Date Taking? Authorizing Provider  acetaminophen (TYLENOL) 500 MG tablet Take 1,000 mg by mouth every 6 (six) hours as needed for moderate pain.    [provider]  ARIPiprazole (ABILIFY) 10 MG tablet Take 10 mg by mouth daily.    [provider]  buprenorphine-naloxone (SUBOXONE) 8-2 mg SUBL SL tablet Place 1 tablet under the tongue 3 (three) times daily as needed (pain).    [provider]  buPROPion (WELLBUTRIN SR) 150 MG 12 hr tablet Take 150 mg by mouth daily.    [provider]  clonazePAM (KLONOPIN) 1 MG tablet Take 1 mg by mouth daily as needed for anxiety.    [provider]  eszopiclone (LUNESTA) 1 MG TABS tablet Take 1 mg by mouth at bedtime. Take immediately before bedtime    [provider]  gabapentin (NEURONTIN) 800 MG tablet Take 800 mg by mouth 3 (three) times daily.  07/12/19 02/12/21  [provider]  ibuprofen (ADVIL) 200 MG tablet Take 800 mg by mouth every 8 (eight) hours as needed for moderate pain.    [provider]  Melatonin 10 MG TABS Take 10 mg by mouth at bedtime.    [provider]  NARCAN 4 MG/0.1ML LIQD nasal  spray kit Place 1 spray into the nose as needed (overdose). 04/30/19   [provider]  pantoprazole (PROTONIX) 40 MG tablet Take 1 tablet (40 mg total) by mouth 2 (two) times daily. 08/07/19   Nita Sells, MD  tiZANidine (ZANAFLEX) 4 MG tablet Take 1 tablet (4 mg total) by mouth every 6 (six) hours as needed for muscle spasms. Patient taking differently: Take 4 mg by mouth every 8 (eight) hours as needed for muscle spasms. 08/30/19   Jaynee Eagles, PA-C  omeprazole (PRILOSEC) 20 MG capsule Take 20 mg by mouth daily.  07/13/19  [provider]      Allergies    Iodinated contrast media, Shellfish allergy, Vancomycin, Nsaids, Bee venom, Penicillins, Metoclopramide, and Ondansetron    Review of Systems   Review of Systems  Constitutional:  Negative for chills and fever.  HENT:  Negative for congestion.   Eyes:  Negative for visual disturbance.  Respiratory:  Negative for shortness of breath.   Cardiovascular:  Negative for chest pain.  Gastrointestinal:  Negative for abdominal pain and vomiting.  Genitourinary:  Negative for dysuria and flank pain.  Musculoskeletal:  Positive for joint swelling and myalgias. Negative for back pain, neck pain and neck stiffness.  Skin:  Negative for rash.  Neurological:  Negative for weakness, light-headedness, numbness and headaches.    Physical Exam Updated  Vital Signs BP (!) 151/121   Pulse (!) 132   Temp 98.5 F (36.9 C)   Resp 18   Ht '6\' 1"'$  (1.854 m)   Wt 102.1 kg   SpO2 98%   BMI 29.69 kg/m  Physical Exam Vitals and nursing note reviewed.  Constitutional:      General: He is not in acute distress.    Appearance: He is well-developed.  HENT:     Head: Normocephalic and atraumatic.     Mouth/Throat:     Mouth: Mucous membranes are moist.  Eyes:     General:        Right eye: No discharge.        Left eye: No discharge.     Conjunctiva/sclera: Conjunctivae normal.  Neck:     Trachea: No tracheal deviation.   Cardiovascular:     Rate and Rhythm: Tachycardia present.  Pulmonary:     Effort: Pulmonary effort is normal.  Abdominal:     General: There is no distension.     Palpations: Abdomen is soft.  Musculoskeletal:        General: Swelling, tenderness and signs of injury present. No deformity.     Cervical back: Normal range of motion and neck supple. No rigidity.     Comments: Patient has tenderness to bilateral forearms and lower bilateral upper arms with mild swelling proximal forearms bilateral and ecchymosis medial aspect of elbow and distal humerus region.  No bony deformity appreciated mild tender to palpation.  Compartments soft bilateral Upper extremities, neurovascular intact.  No open wounds, mild erythema from contact.  Skin:    General: Skin is warm.     Capillary Refill: Capillary refill takes less than 2 seconds.  Neurological:     General: No focal deficit present.     Mental Status: He is alert.  Psychiatric:        Mood and Affect: Mood is anxious.     ED Results / Procedures / Treatments   Labs (all labs ordered are listed, but only abnormal results are displayed) Labs Reviewed - No data to display  EKG None  Radiology No results found.  Procedures Procedures    Medications Ordered in ED Medications - No data to display  ED Course/ Medical Decision Making/ A&P                           Medical Decision Making  Patient presents with bilateral upper arm injury concern for muscle contusion and superficial injuries however due to mechanism and persistent pain plan for x-rays to look for any fractures.    Patient vital signs reviewed and tachycardic and blood pressure elevated likely combination of pain and anxiety, other differentials include withdrawal or more significant pathology such as fracture.  Unable to repeat heart rate as patient left AGAINST MEDICAL ADVICE.  Heart rate in the room for me was 115.  Compartments are soft and no worsening of pain  with passive range of motion and neurovascular intact so very low suspicion for compartment syndrome however patient requesting to leave after pain medicine discussion.  Initially oral pain meds ordered he said that would not be strong enough and I discussed the risk and benefit especially with him being in a treatment program at this time.  He said he needed to check with that program to see if he could receive intramuscular narcotics.  X-rays were ordered and x-ray technician came immediately to perform x-rays  however patient refused.  Patient left AGAINST MEDICAL ADVICE.        Final Clinical Impression(s) / ED Diagnoses Final diagnoses:  Arm contusion, left, initial encounter  Arm contusion, right, initial encounter  Contusion of right hand, initial encounter  Contusion of left hand, initial encounter  Acute myofascial strain of lumbar region, initial encounter    Rx / DC Orders ED Discharge Orders     None         Elnora Morrison, MD 02/25/22 [id number]    Elnora Morrison, MD 02/25/22 [phone]

## 2022-02-25 NOTE — ED Notes (Signed)
Pt leaving AMA. States he would like to speak with his sponsor before receiving pain meds, XR and tx. Pt signed acknowledging that by leaving AMA that his condition could worsen.

## 2022-02-25 NOTE — Discharge Instructions (Signed)
For severe pain take norco or vicodin however realize they have the potential for addiction and it can make you sleepy and has tylenol in it.  No operating machinery while taking. Use Tylenol every 4 hours as needed for pain. Call for appointment and follow-up with orthopedics.  Use ice regularly every 3-4 hours while awake for 10 minutes at a time.  Return for uncontrolled pain or new concerns.

## 2022-02-26 ENCOUNTER — Encounter (HOSPITAL_BASED_OUTPATIENT_CLINIC_OR_DEPARTMENT_OTHER): Payer: Self-pay | Admitting: Emergency Medicine

## 2022-02-26 ENCOUNTER — Emergency Department (HOSPITAL_BASED_OUTPATIENT_CLINIC_OR_DEPARTMENT_OTHER): Payer: Self-pay | Admitting: Radiology

## 2022-02-26 ENCOUNTER — Emergency Department (HOSPITAL_BASED_OUTPATIENT_CLINIC_OR_DEPARTMENT_OTHER)
Admission: EM | Admit: 2022-02-26 | Discharge: 2022-02-26 | Disposition: A | Discharge status: Home / Self Care | Payer: Self-pay | Attending: Emergency Medicine | Admitting: Emergency Medicine

## 2022-02-26 ENCOUNTER — Other Ambulatory Visit: Payer: Self-pay

## 2022-02-26 DIAGNOSIS — Z1152 Encounter for screening for COVID-19: Secondary | ICD-10-CM | POA: Insufficient documentation

## 2022-02-26 DIAGNOSIS — W231XXA Caught, crushed, jammed, or pinched between stationary objects, initial encounter: Secondary | ICD-10-CM | POA: Insufficient documentation

## 2022-02-26 DIAGNOSIS — S39012A Strain of muscle, fascia and tendon of lower back, initial encounter: Secondary | ICD-10-CM | POA: Insufficient documentation

## 2022-02-26 DIAGNOSIS — S479XXD Crushing injury of shoulder and upper arm, unspecified arm, subsequent encounter: Secondary | ICD-10-CM | POA: Insufficient documentation

## 2022-02-26 DIAGNOSIS — I10 Essential (primary) hypertension: Secondary | ICD-10-CM | POA: Insufficient documentation

## 2022-02-26 LAB — COMPREHENSIVE METABOLIC PANEL
ALT: 46 U/L — ABNORMAL HIGH (ref 0–44)
AST: 40 U/L (ref 15–41)
Albumin: 4 g/dL (ref 3.5–5.0)
Alkaline Phosphatase: 81 U/L (ref 38–126)
Anion gap: 10 (ref 5–15)
BUN: 21 mg/dL — ABNORMAL HIGH (ref 6–20)
CO2: 27 mmol/L (ref 22–32)
Calcium: 9.4 mg/dL (ref 8.9–10.3)
Chloride: 100 mmol/L (ref 98–111)
Creatinine, Ser: 0.65 mg/dL (ref 0.61–1.24)
GFR, Estimated: >60 mL/min (ref >60)
Glucose, Bld: 95 mg/dL (ref 70–99)
Potassium: 4.4 mmol/L (ref 3.5–5.1)
Sodium: 137 mmol/L (ref 135–145)
Total Bilirubin: 0.5 mg/dL (ref 0.3–1.2)
Total Protein: 7.9 g/dL (ref 6.5–8.1)

## 2022-02-26 LAB — CBC WITH DIFFERENTIAL/PLATELET
Abs Immature Granulocytes: 0.04 10*3/uL (ref 0.00–0.07)
Basophils Absolute: 0 10*3/uL (ref 0.0–0.1)
Basophils Relative: 0 %
Eosinophils Absolute: 0.1 10*3/uL (ref 0.0–0.5)
Eosinophils Relative: 1 %
HCT: 36 % — ABNORMAL LOW (ref 39.0–52.0)
Hemoglobin: 11.4 g/dL — ABNORMAL LOW (ref 13.0–17.0)
Immature Granulocytes: 0 %
Lymphocytes Relative: 15 %
Lymphs Abs: 1.6 10*3/uL (ref 0.7–4.0)
MCH: 25.1 pg — ABNORMAL LOW (ref 26.0–34.0)
MCHC: 31.7 g/dL (ref 30.0–36.0)
MCV: 79.1 fL — ABNORMAL LOW (ref 80.0–100.0)
Monocytes Absolute: 0.6 10*3/uL (ref 0.1–1.0)
Monocytes Relative: 5 %
Neutro Abs: 8.8 10*3/uL — ABNORMAL HIGH (ref 1.7–7.7)
Neutrophils Relative %: 79 %
Platelets: 348 10*3/uL (ref 150–400)
RBC: 4.55 MIL/uL (ref 4.22–5.81)
RDW: 14.7 % (ref 11.5–15.5)
WBC: 11.2 10*3/uL — ABNORMAL HIGH (ref 4.0–10.5)
nRBC: 0 % (ref 0.0–0.2)

## 2022-02-26 LAB — LACTIC ACID, PLASMA: Lactic Acid, Venous: 1 mmol/L (ref 0.5–1.9)

## 2022-02-26 LAB — RESP PANEL BY RT-PCR (RSV, FLU A&B, COVID)  RVPGX2
Influenza A by PCR: NEGATIVE
Influenza B by PCR: NEGATIVE
Resp Syncytial Virus by PCR: NEGATIVE
SARS Coronavirus 2 by RT PCR: NEGATIVE

## 2022-02-26 LAB — CK: Total CK: 469 U/L — ABNORMAL HIGH (ref 49–397)

## 2022-02-26 MED ORDER — OXYCODONE-ACETAMINOPHEN 5-325 MG PO TABS: 1.0000 | ORAL_TABLET | Freq: Once | ORAL | Status: DC

## 2022-02-26 MED ORDER — KETOROLAC TROMETHAMINE 15 MG/ML IJ SOLN
15.0000 mg | Freq: Once | INTRAMUSCULAR | Status: AC
  Administered 2022-02-26: 15 mg via INTRAVENOUS
  Filled 2022-02-26: qty 1

## 2022-02-26 MED ORDER — FENTANYL CITRATE PF 50 MCG/ML IJ SOSY
50.0000 ug | PREFILLED_SYRINGE | Freq: Once | INTRAMUSCULAR | Status: AC
  Administered 2022-02-26: 50 ug via INTRAVENOUS
  Filled 2022-02-26: qty 1

## 2022-02-26 MED ORDER — METHOCARBAMOL 500 MG PO TABS: 500.0000 mg | ORAL_TABLET | Freq: Two times a day (BID) | ORAL | 0 refills | Status: AC

## 2022-02-26 MED ORDER — SODIUM CHLORIDE 0.9 % IV BOLUS
1000.0000 mL | Freq: Once | INTRAVENOUS | Status: AC
  Administered 2022-02-26: 1000 mL via INTRAVENOUS

## 2022-02-26 MED ORDER — METOCLOPRAMIDE HCL 5 MG/ML IJ SOLN
10.0000 mg | Freq: Once | INTRAMUSCULAR | Status: AC
  Administered 2022-02-26: 10 mg via INTRAVENOUS
  Filled 2022-02-26: qty 2

## 2022-02-26 MED ORDER — ONDANSETRON HCL 4 MG/2ML IJ SOLN
4.0000 mg | Freq: Once | INTRAMUSCULAR | Status: DC
  Filled 2022-02-26: qty 2

## 2022-02-26 NOTE — ED Notes (Signed)
Patient transported to X-ray 

## 2022-02-26 NOTE — ED Triage Notes (Signed)
Pt arrives to ED with c/o bilateral arm injury, shoulder injury, and lower back pain that occurred on 1/9 after an incident with a 10,000lb trailer.

## 2022-02-26 NOTE — Discharge Instructions (Signed)
Robaxin as prescribed. Follow-up with your primary care provider.  Also provided with referral to orthopedics for recheck and discuss concern for rotator cuff injury.  Be sure to drink plenty of fluids.

## 2022-02-26 NOTE — ED Provider Notes (Signed)
[facility] Provider Note   CSN: [id number] Arrival date & time: 02/26/22  1319     History  Chief Complaint  Patient presents with   Arm Injury   Shoulder Injury   Back Pain    Walter Kemp is a 32 y.o. male.  32 year old male with past medical history of hypertension, pancreatitis, bipolar 2 disorder, GERD, seizures and substance abuse presents with concern for crush injury to the upper remedies and low back pain.  Patient states that 3 days ago he was helping a friend fix a trailer when the jack gave way causing the tongue of the trailer to pin his arms to the ground.  States he was pinned under the trailer for approximately 2 minutes.  The sudden movement pulling him to the ground is resulted in lower back pain as well.  Reports pain, swelling, bruising to bilateral upper extremities.  Patient came to the ER yesterday for same however left without any imaging.  He went to his PCP office today for fever of 101.1 with URI symptoms.  Has been treating pain at home with Oxy.  Reports prior crush injury to the arm resulting in fasciotomy also history of rotator cuff injury, suspect same as he is having some difficulty lifting his left shoulder.       Home Medications Prior to Admission medications   Medication Sig Start Date End Date Taking? Authorizing Provider  methocarbamol (ROBAXIN) 500 MG tablet Take 1 tablet (500 mg total) by mouth 2 (two) times daily. 02/26/22  Yes Tacy Learn, PA-C  acetaminophen (TYLENOL) 500 MG tablet Take 1,000 mg by mouth every 6 (six) hours as needed for moderate pain.    [provider]  ARIPiprazole (ABILIFY) 10 MG tablet Take 10 mg by mouth daily.    [provider]  buprenorphine-naloxone (SUBOXONE) 8-2 mg SUBL SL tablet Place 1 tablet under the tongue 3 (three) times daily as needed (pain).    [provider]  buPROPion (WELLBUTRIN SR) 150 MG 12 hr tablet Take 150 mg by mouth daily.     [provider]  clonazePAM (KLONOPIN) 1 MG tablet Take 1 mg by mouth daily as needed for anxiety.    [provider]  eszopiclone (LUNESTA) 1 MG TABS tablet Take 1 mg by mouth at bedtime. Take immediately before bedtime    [provider]  gabapentin (NEURONTIN) 800 MG tablet Take 800 mg by mouth 3 (three) times daily.  07/12/19 02/12/21  [provider]  ibuprofen (ADVIL) 200 MG tablet Take 800 mg by mouth every 8 (eight) hours as needed for moderate pain.    [provider]  Melatonin 10 MG TABS Take 10 mg by mouth at bedtime.    [provider]  NARCAN 4 MG/0.1ML LIQD nasal spray kit Place 1 spray into the nose as needed (overdose). 04/30/19   [provider]  pantoprazole (PROTONIX) 40 MG tablet Take 1 tablet (40 mg total) by mouth 2 (two) times daily. 08/07/19   Nita Sells, MD  tiZANidine (ZANAFLEX) 4 MG tablet Take 1 tablet (4 mg total) by mouth every 6 (six) hours as needed for muscle spasms. Patient taking differently: Take 4 mg by mouth every 8 (eight) hours as needed for muscle spasms. 08/30/19   Jaynee Eagles, PA-C  omeprazole (PRILOSEC) 20 MG capsule Take 20 mg by mouth daily.  07/13/19  [provider]      Allergies    Iodinated contrast media, Shellfish allergy, Vancomycin,  Nsaids, Bee venom, Penicillins, Metoclopramide, and Ondansetron    Review of Systems   Review of Systems Negative except as per HPI Physical Exam Updated Vital Signs BP (!) 146/80   Pulse 88   Temp 99.3 F (37.4 C) (Oral)   Resp 18   SpO2 95%  Physical Exam Vitals and nursing note reviewed.  Constitutional:      General: He is not in acute distress.    Appearance: He is well-developed. He is diaphoretic.  HENT:     Head: Normocephalic and atraumatic.  Cardiovascular:     Pulses: Normal pulses.  Pulmonary:     Effort: Pulmonary effort is normal.  Musculoskeletal:        General: Swelling, tenderness and signs of injury  present.     Lumbar back: Tenderness present. No bony tenderness.       Back:     Comments: Swelling and ecchymosis to bilateral upper extremities more so proximally.  Does have swelling of bilateral hands, able to flex and extend and range elbows, has difficulty raising left arm.  Radial pulses strong, sensation intact, brisk cap refill present.  Compartments do not feel tight.  Skin:    General: Skin is warm.     Capillary Refill: Capillary refill takes less than 2 seconds.     Findings: No erythema or rash.  Neurological:     Mental Status: He is alert and oriented to person, place, and time.     Sensory: No sensory deficit.  Psychiatric:        Behavior: Behavior normal.     ED Results / Procedures / Treatments   Labs (all labs ordered are listed, but only abnormal results are displayed) Labs Reviewed  CBC WITH DIFFERENTIAL/PLATELET - Abnormal; Notable for the following components:      Result Value   WBC 11.2 (*)    Hemoglobin 11.4 (*)    HCT 36.0 (*)    MCV 79.1 (*)    MCH 25.1 (*)    Neutro Abs 8.8 (*)    All other components within normal limits  COMPREHENSIVE METABOLIC PANEL - Abnormal; Notable for the following components:   BUN 21 (*)    ALT 46 (*)    All other components within normal limits  CK - Abnormal; Notable for the following components:   Total CK 469 (*)    All other components within normal limits  RESP PANEL BY RT-PCR (RSV, FLU A&B, COVID)  RVPGX2  LACTIC ACID, PLASMA    EKG None  Radiology DG Lumbar Spine Complete  Result Date: 02/26/2022 CLINICAL DATA:  Recent injury with low back pain. EXAM: LUMBAR SPINE - COMPLETE 4+ VIEW COMPARISON:  None Available. FINDINGS: There is no evidence of lumbar spine fracture. Alignment is normal. Intervertebral disc spaces are maintained. IMPRESSION: Negative. Electronically Signed   By: Aletta Edouard M.D.   On: 02/26/2022 15:41   DG Humerus Right  Result Date: 02/26/2022 CLINICAL DATA:  Trauma EXAM: LEFT  HUMERUS - 2 VIEW; RIGHT HUMERUS - 2 VIEW COMPARISON:  None Available. FINDINGS: There is no evidence of fracture or other focal bone lesions. Soft tissues are unremarkable. IMPRESSION: Negative. Electronically Signed   By: Sammie Bench M.D.   On: 02/26/2022 15:37   DG Humerus Left  Result Date: 02/26/2022 CLINICAL DATA:  Trauma EXAM: LEFT HUMERUS - 2 VIEW; RIGHT HUMERUS - 2 VIEW COMPARISON:  None Available. FINDINGS: There is no evidence of fracture or other focal bone lesions. Soft tissues are  unremarkable. IMPRESSION: Negative. Electronically Signed   By: Sammie Bench M.D.   On: 02/26/2022 15:37   DG Forearm Left  Result Date: 02/26/2022 CLINICAL DATA:  Trauma EXAM: RIGHT FOREARM - 2 VIEW; LEFT FOREARM - 2 VIEW COMPARISON:  None Available. FINDINGS: There is no evidence of fracture or other focal bone lesions. Nonspecific diffuse soft tissue swelling noted. IMPRESSION: Soft tissue swelling.  No osseous abnormalities identified. Electronically Signed   By: Sammie Bench M.D.   On: 02/26/2022 15:36   DG Forearm Right  Result Date: 02/26/2022 CLINICAL DATA:  Trauma EXAM: RIGHT FOREARM - 2 VIEW; LEFT FOREARM - 2 VIEW COMPARISON:  None Available. FINDINGS: There is no evidence of fracture or other focal bone lesions. Nonspecific diffuse soft tissue swelling noted. IMPRESSION: Soft tissue swelling.  No osseous abnormalities identified. Electronically Signed   By: Sammie Bench M.D.   On: 02/26/2022 15:36   DG Hand Complete Right  Result Date: 02/26/2022 CLINICAL DATA:  Trauma EXAM: RIGHT HAND - COMPLETE 3 VIEW; LEFT HAND - COMPLETE 3 VIEW COMPARISON:  None Available. FINDINGS: There is no evidence of fracture or dislocation. There is no evidence of arthropathy or other focal bone abnormality. Soft tissue swelling noted on the right at the fourth PIP joint. IMPRESSION: Soft tissue swelling right fourth PIP. No acute osseous abnormalities. Electronically Signed   By: Sammie Bench M.D.    On: 02/26/2022 15:35   DG Hand Complete Left  Result Date: 02/26/2022 CLINICAL DATA:  Trauma EXAM: RIGHT HAND - COMPLETE 3 VIEW; LEFT HAND - COMPLETE 3 VIEW COMPARISON:  None Available. FINDINGS: There is no evidence of fracture or dislocation. There is no evidence of arthropathy or other focal bone abnormality. Soft tissue swelling noted on the right at the fourth PIP joint. IMPRESSION: Soft tissue swelling right fourth PIP. No acute osseous abnormalities. Electronically Signed   By: Sammie Bench M.D.   On: 02/26/2022 15:35    Procedures Procedures    Medications Ordered in ED Medications  sodium chloride 0.9 % bolus 1,000 mL (0 mLs Intravenous Stopped 02/26/22 1611)  fentaNYL (SUBLIMAZE) injection 50 mcg (50 mcg Intravenous Given 02/26/22 1451)  metoCLOPramide (REGLAN) injection 10 mg (10 mg Intravenous Given 02/26/22 1451)  ketorolac (TORADOL) 15 MG/ML injection 15 mg (15 mg Intravenous Given 02/26/22 1539)    ED Course/ Medical Decision Making/ A&P                             Medical Decision Making Amount and/or Complexity of Data Reviewed Labs: ordered. Radiology: ordered.  Risk Prescription drug management.   32 year old male with history of substance abuse presents with crush injury to bilateral upper extremities as above.  Previously left the ER without imaging, returns for further care.  Was at PCP office today where he was found to be febrile.  Arrives normal temperature, hypertensive, tachycardic.  Patient is provided with IV fluids, fentanyl, Toradol for pain with improvement in pain.  X-rays of bilateral upper extremities are negative for fracture, do show soft tissue swelling.  He does have soft tissue swelling with ecchymosis to the upper extremities, pulses intact, brisk capillary refill present, compartments are soft, do not suspect compartment syndrome at this time.  CK is mildly elevated, treated with IV fluids.  Normal renal function.  Patient is discharged with  Robaxin, referred to orthopedics for further evaluation.        Final Clinical Impression(s) / ED Diagnoses  Final diagnoses:  Crushing injury of upper extremity, unspecified laterality, subsequent encounter  Strain of lumbar region, initial encounter    Rx / DC Orders ED Discharge Orders          Ordered    methocarbamol (ROBAXIN) 500 MG tablet  2 times daily        02/26/22 1629              Tacy Learn, PA-C 02/26/22 1817    Hayden Rasmussen, MD 02/27/22 [phone]

## 2022-08-12 IMAGING — MR MR LUMBAR SPINE W/O CM
4 of 5 series · 28 of 48 positions shown · non-contrast
Comparison: Report from lumbar spine radiographs 09/30/2010 (images
unavailable). CT abdomen/pelvis 08/05/2019.

CLINICAL DATA: Other low back pain. Additional history provided by
scanning technologist: Patient reports low back pain that radiates
down left leg and sometimes into right hip status post injury in
[place].

EXAM:
MRI LUMBAR SPINE WITHOUT CONTRAST
TECHNIQUE: Multiplanar, multisequence MR imaging of the lumbar spine was
performed. No intravenous contrast was administered.

[Series 5: T2 · sagittal · 4.0mm · 0.73mm/px · 8 of 19 slices shown (1 of 2)]
[im 1/19]
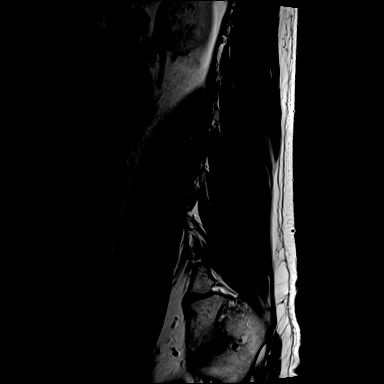
[im 3/19]
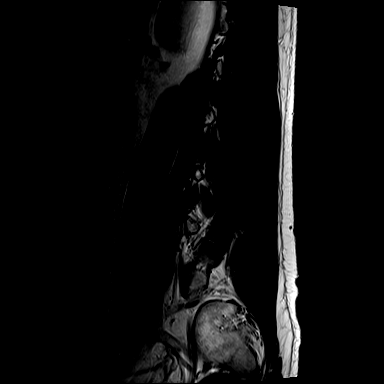
[im 6/19]
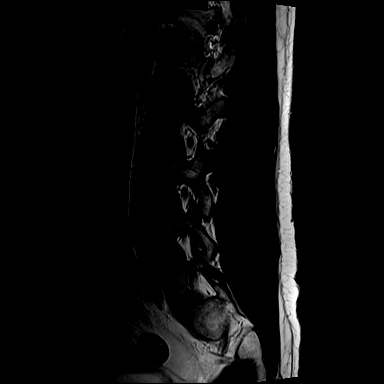
[im 8/19]
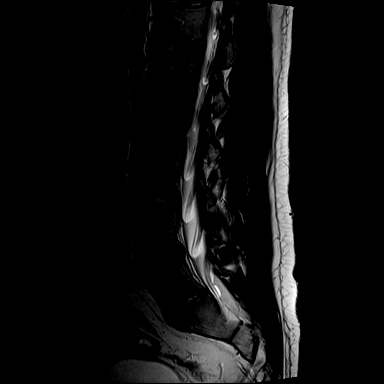
[im 11/19]
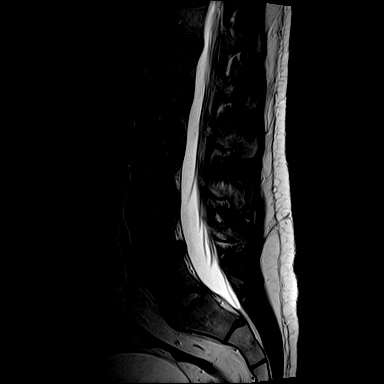
[im 13/19]
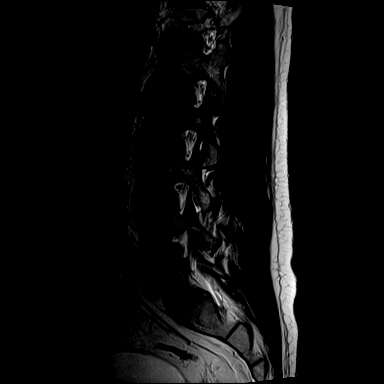
[im 16/19]
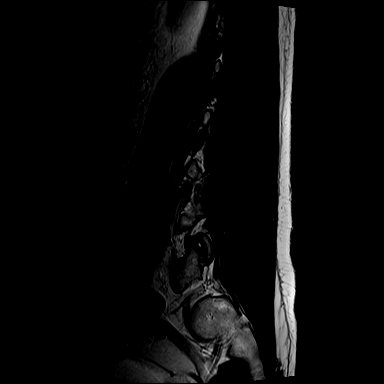
[im 19/19]
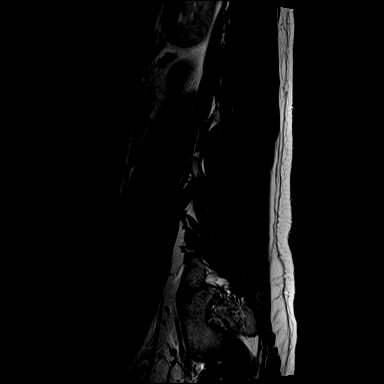

[Series 7: T1 · sagittal · 4.0mm · 0.94mm/px · 7 of 19 slices shown (1 of 2)]
[im 1/19]
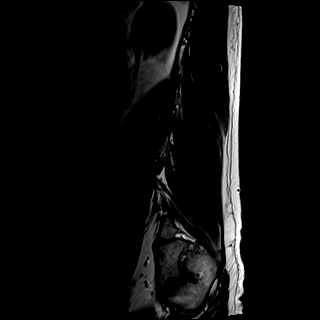
[im 4/19]
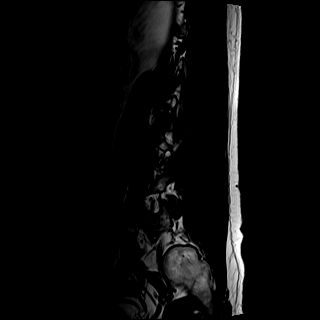
[im 7/19]
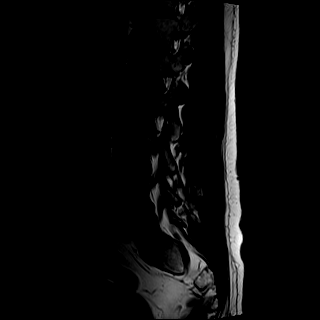
[im 10/19]
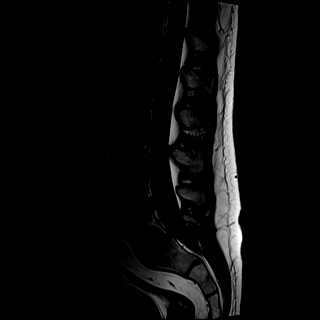
[im 13/19]
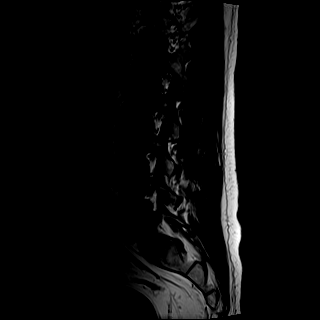
[im 16/19]
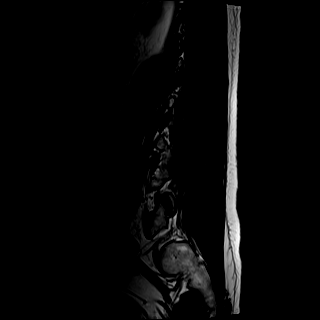
[im 19/19]
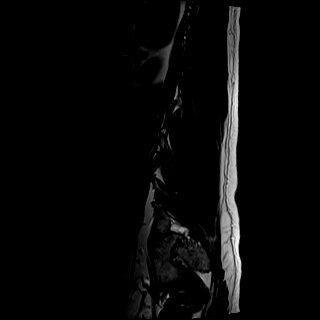

[Series 8: T2 · axial · 4.0mm · 0.57mm/px · z∈[-155,+60]mm · 9 of 35 slices shown (2 of 2)]
[im 1/35]
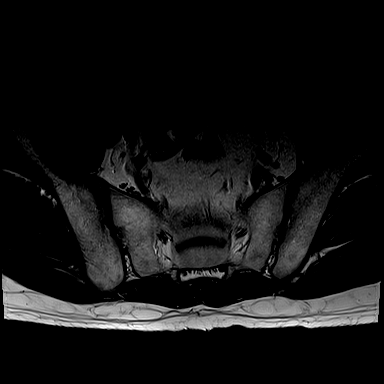
[im 6/35]
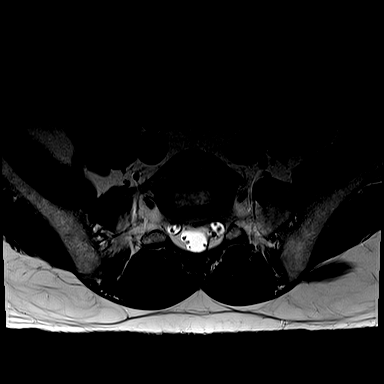
[im 12/35]
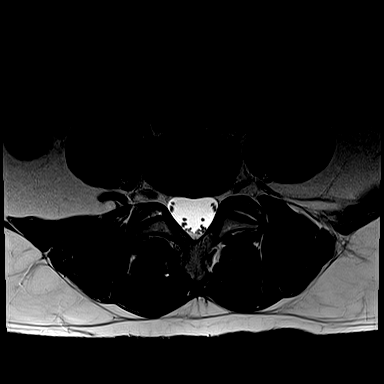
[im 15/35]
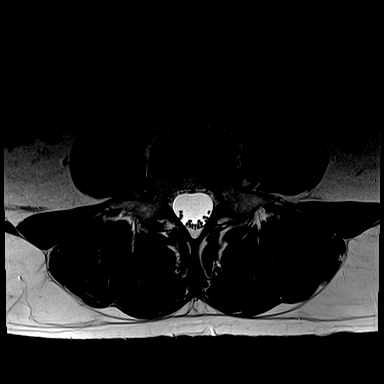
[im 18/35]
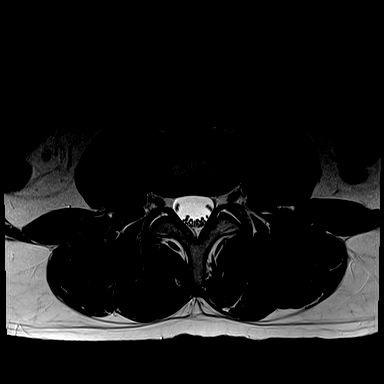
[im 20/35]
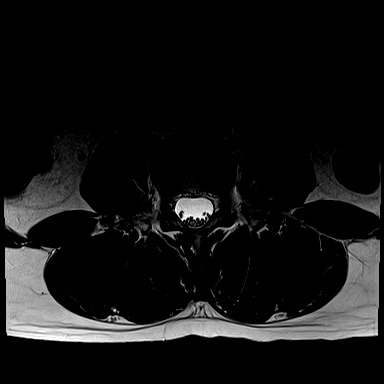
[im 23/35]
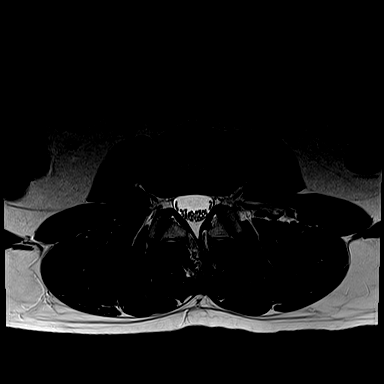
[im 29/35]
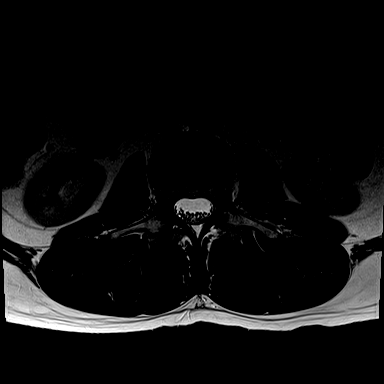
[im 35/35]
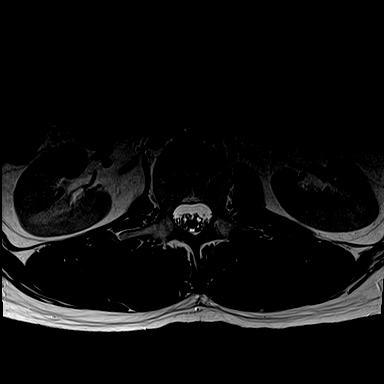

[Series 9: T1 · axial · 4.0mm · 0.34mm/px · z∈[-155,+30]mm · 4 of 35 slices shown (2 of 2)]
[im 1/35]
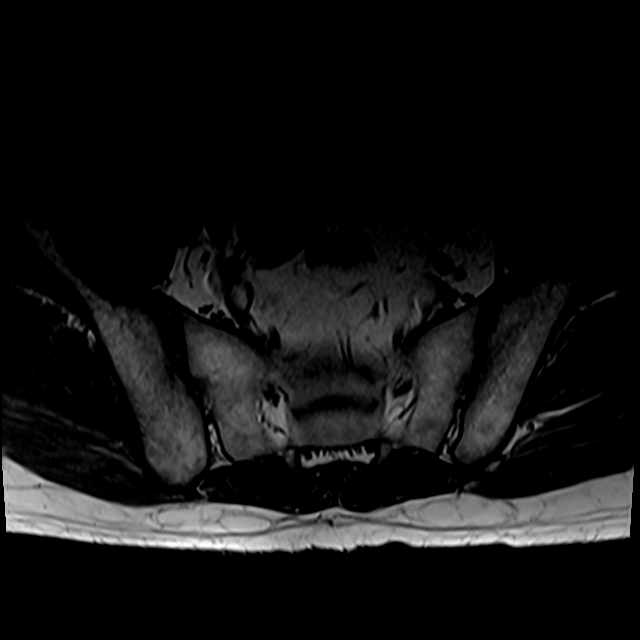
[im 6/35]
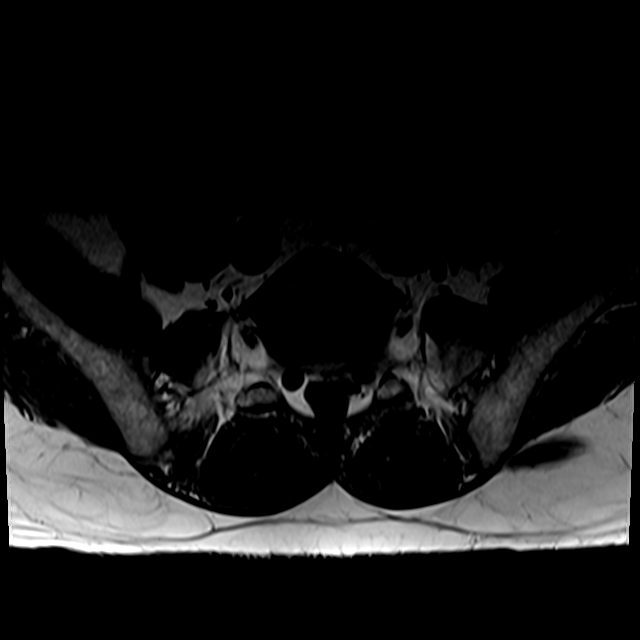
[im 18/35]
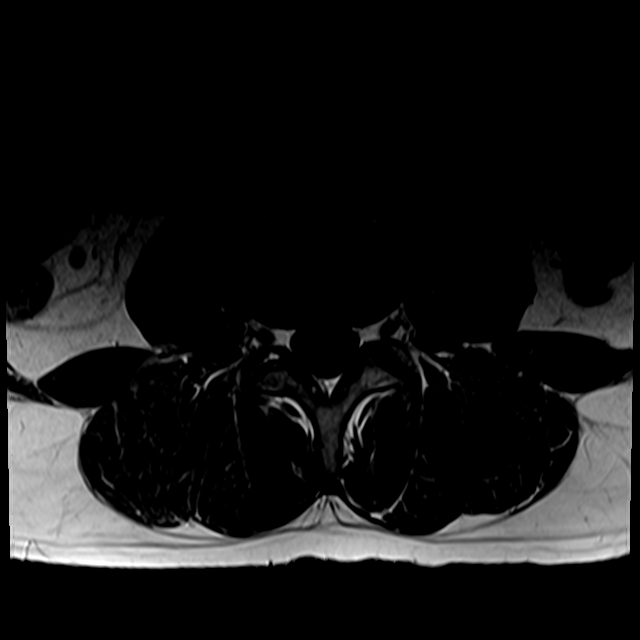
[im 29/35]
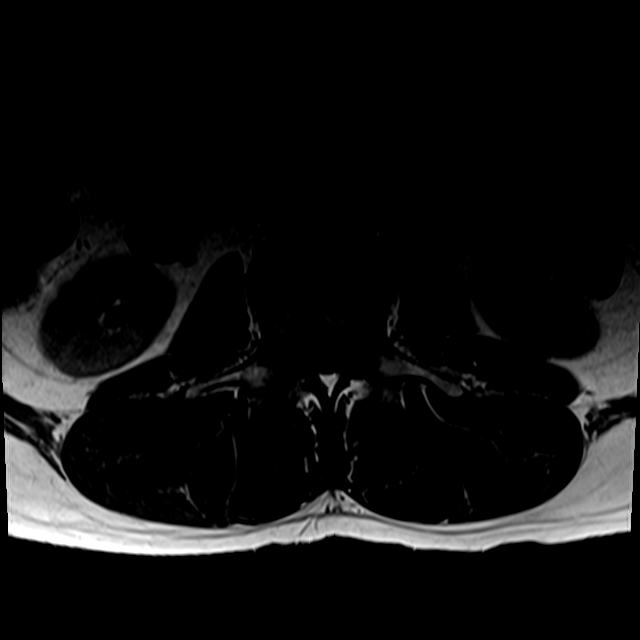

[28 of 48 positions shown; findings below may reference images not displayed]

FINDINGS: Segmentation: Transitional lumbosacral anatomy is identified. For
the purposes of this dictation, there is partial sacralization of
the L5 vertebra with a small but well-formed L5-S1 intervertebral
disc space and bilateral L5-S1 assimilation joints. Absent ribs at
the T12 level.

Alignment:  No significant spondylolisthesis.

Vertebrae: Vertebral body height is maintained. Small Schmorl nodes
within the T12 and L1 inferior endplates. No significant marrow
edema or focal suspicious osseous lesion. L5 spina bifida occulta
was better appreciated on the prior CT of 08/05/2019.

Conus medullaris and cauda equina: Conus extends to the L2 level. No
signal abnormality within the visualized distal spinal cord.

Paraspinal and other soft tissues: Distension of the partially
imaged bladder. Paraspinal soft tissues within normal limits.

Disc levels:

Mild L4-L5 disc degeneration.

L1-L2: No significant disc herniation or stenosis.

L2-L3: No significant disc herniation or stenosis.

L3-L4: No significant disc herniation or stenosis.

L4-L5: Broad-based shallow central to left subarticular disc
protrusion. No significant spinal canal stenosis or neural foraminal
narrowing.

L5-S1: Transitional level. No significant disc herniation or
stenosis.
IMPRESSION: Transitional lumbosacral anatomy with spinal numbering as described.

At L4-L5, there is mild disc degeneration. Broad-based shallow
central to left subarticular disc protrusion. No significant spinal
canal stenosis or neural foraminal narrowing.

No significant disc herniation, spinal canal stenosis or neural
foraminal narrowing at the remaining lumbar levels.

L5 spina bifida occulta better appreciated on the CT abdomen/pelvis
of 08/05/2019

## 2022-11-03 ENCOUNTER — Other Ambulatory Visit: Payer: Self-pay

## 2022-11-03 ENCOUNTER — Encounter (HOSPITAL_COMMUNITY): Payer: Self-pay | Admitting: *Deleted

## 2022-11-03 ENCOUNTER — Emergency Department (HOSPITAL_COMMUNITY): Payer: MEDICAID

## 2022-11-03 ENCOUNTER — Emergency Department (HOSPITAL_COMMUNITY)
Admission: EM | Admit: 2022-11-03 | Discharge: 2022-11-03 | Disposition: A | Discharge status: Home / Self Care | Payer: MEDICAID | Attending: Student | Admitting: Student

## 2022-11-03 DIAGNOSIS — K920 Hematemesis: Secondary | ICD-10-CM | POA: Diagnosis not present

## 2022-11-03 DIAGNOSIS — R042 Hemoptysis: Secondary | ICD-10-CM | POA: Insufficient documentation

## 2022-11-03 DIAGNOSIS — Z87891 Personal history of nicotine dependence: Secondary | ICD-10-CM | POA: Insufficient documentation

## 2022-11-03 DIAGNOSIS — I10 Essential (primary) hypertension: Secondary | ICD-10-CM | POA: Insufficient documentation

## 2022-11-03 DIAGNOSIS — R1013 Epigastric pain: Secondary | ICD-10-CM | POA: Diagnosis not present

## 2022-11-03 DIAGNOSIS — R109 Unspecified abdominal pain: Secondary | ICD-10-CM | POA: Diagnosis present

## 2022-11-03 DIAGNOSIS — J189 Pneumonia, unspecified organism: Secondary | ICD-10-CM

## 2022-11-03 LAB — COMPREHENSIVE METABOLIC PANEL WITH GFR
ALT: 55 U/L — ABNORMAL HIGH (ref 0–44)
AST: 38 U/L (ref 15–41)
Albumin: 3.9 g/dL (ref 3.5–5.0)
Alkaline Phosphatase: 69 U/L (ref 38–126)
Anion gap: 12 (ref 5–15)
BUN: 20 mg/dL (ref 6–20)
CO2: 23 mmol/L (ref 22–32)
Calcium: 9.3 mg/dL (ref 8.9–10.3)
Chloride: 107 mmol/L (ref 98–111)
Creatinine, Ser: 0.64 mg/dL (ref 0.61–1.24)
GFR, Estimated: >60 mL/min (ref >60)
Glucose, Bld: 107 mg/dL — ABNORMAL HIGH (ref 70–99)
Potassium: 3.8 mmol/L (ref 3.5–5.1)
Sodium: 142 mmol/L (ref 135–145)
Total Bilirubin: 0.6 mg/dL (ref 0.3–1.2)
Total Protein: 8 g/dL (ref 6.5–8.1)

## 2022-11-03 LAB — CBC
HCT: 40.1 % (ref 39.0–52.0)
Hemoglobin: 12.9 g/dL — ABNORMAL LOW (ref 13.0–17.0)
MCH: 27.6 pg (ref 26.0–34.0)
MCHC: 32.2 g/dL (ref 30.0–36.0)
MCV: 85.9 fL (ref 80.0–100.0)
Platelets: 283 10*3/uL (ref 150–400)
RBC: 4.67 MIL/uL (ref 4.22–5.81)
RDW: 12.5 % (ref 11.5–15.5)
WBC: 6.9 10*3/uL (ref 4.0–10.5)
nRBC: 0 % (ref 0.0–0.2)

## 2022-11-03 LAB — RAPID HIV SCREEN (HIV 1/2 AB+AG)
HIV 1/2 Antibodies: NONREACTIVE
HIV-1 P24 Antigen - HIV24: NONREACTIVE

## 2022-11-03 LAB — LIPASE, BLOOD: Lipase: 21 U/L (ref 11–51)

## 2022-11-03 MED ORDER — LORAZEPAM 2 MG/ML IJ SOLN
1.0000 mg | Freq: Once | INTRAMUSCULAR | Status: AC
  Administered 2022-11-03: 1 mg via INTRAVENOUS
  Filled 2022-11-03: qty 1

## 2022-11-03 MED ORDER — KETAMINE HCL 50 MG/5ML IJ SOSY
0.3000 mg/kg | PREFILLED_SYRINGE | Freq: Once | INTRAMUSCULAR | Status: AC
  Administered 2022-11-03: 34 mg via INTRAVENOUS
  Filled 2022-11-03: qty 5

## 2022-11-03 MED ORDER — PANTOPRAZOLE SODIUM 40 MG IV SOLR
40.0000 mg | Freq: Once | INTRAVENOUS | Status: AC
  Administered 2022-11-03: 40 mg via INTRAVENOUS
  Filled 2022-11-03: qty 10

## 2022-11-03 MED ORDER — ALUM & MAG HYDROXIDE-SIMETH 200-200-20 MG/5ML PO SUSP
30.0000 mL | Freq: Once | ORAL | Status: AC
  Administered 2022-11-03: 30 mL via ORAL
  Filled 2022-11-03: qty 30

## 2022-11-03 MED ORDER — LIDOCAINE VISCOUS HCL 2 % MT SOLN
15.0000 mL | Freq: Once | OROMUCOSAL | Status: AC
  Administered 2022-11-03: 15 mL via ORAL
  Filled 2022-11-03: qty 15

## 2022-11-03 MED ORDER — PANTOPRAZOLE SODIUM 40 MG PO TBEC: 40.0000 mg | DELAYED_RELEASE_TABLET | Freq: Two times a day (BID) | ORAL | 0 refills | Status: AC

## 2022-11-03 MED ORDER — MORPHINE SULFATE (PF) 4 MG/ML IV SOLN
4.0000 mg | Freq: Once | INTRAVENOUS | Status: AC
  Administered 2022-11-03: 4 mg via INTRAVENOUS
  Filled 2022-11-03: qty 1

## 2022-11-03 MED ORDER — DROPERIDOL 2.5 MG/ML IJ SOLN
1.2500 mg | Freq: Once | INTRAMUSCULAR | Status: AC
  Administered 2022-11-03: 1.25 mg via INTRAVENOUS
  Filled 2022-11-03: qty 2

## 2022-11-03 MED ORDER — LACTATED RINGERS IV BOLUS
1000.0000 mL | Freq: Once | INTRAVENOUS | Status: AC
  Administered 2022-11-03: 1000 mL via INTRAVENOUS

## 2022-11-03 NOTE — ED Triage Notes (Signed)
36 hours of left upper abd pain with N/V. States it feels like his Pancreatitis. Pt now states he has dark coffee ground with blood in emesis.

## 2022-11-03 NOTE — ED Provider Notes (Signed)
[place] EMERGENCY DEPARTMENT AT [facility] Provider Note  CSN: [id number] Arrival date & time: 11/03/22 7564  Chief Complaint(s) Abdominal Pain, Emesis, and Nausea  HPI Walter Kemp is a 32 y.o. 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  Patient was seen on 10/29/2022 at [facility] emergency department with an overall negative workup but was found to have pneumonia.  Has been taking his antibiotics and this morning woke up with severe epigastric pain and associated nausea and vomiting.  No blood in the vomit.  He states that he saw "black specks" in his cough but not in his vomit.  He arrives stating that he gets drug tested for his substance abuse program and would not like any opioid pain medicines.  He states that he received pain dose ketamine at his last ER presentation and this helped a lot with his pain.  Denies shortness of breath, diarrhea, headache, fever or other systemic symptoms.   Past Medical History Past Medical History:  Diagnosis Date   Anxiety    Bipolar 2 disorder (HCC)    w depression and PTSD.  [name].     Blood transfusion without reported diagnosis    due to injury in service   Chronic generalized abdominal pain since at least 2013   GI MD felt he has functional abd pain.     Complication of anesthesia    "had issues breathing" "had to be intubated"  "years ago" per pt   Depression    Gastroparesis 2012   Nuc med gastric emptying study: 12% emptying at 2 hours, normal range is 45 to 79%.   GERD (gastroesophageal reflux disease)    Hepatitis    Hepatitis C   Hepatitis C antibody positive in blood 10/2014   genotype 1a.  no viral counts in care everywhere (as of search 07/2019).     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  Hospital 12/2018 for IV Fentanyl   Patient Active Problem List   Diagnosis Date Noted   Abdominal pain    Epigastric pain    Hematemesis 08/05/2019   Bipolar affective disorder (HCC)    Hypertension    Home Medication(s) Prior to Admission medications   Medication Sig Start Date End Date Taking? Authorizing Provider  acetaminophen (TYLENOL) 500 MG tablet Take 1,000 mg by mouth every 6 (six) hours as needed for moderate pain.    [provider]  ARIPiprazole (ABILIFY) 10 MG tablet Take 10 mg by mouth daily.    [provider]  buprenorphine-naloxone (SUBOXONE) 8-2 mg SUBL SL tablet Place 1 tablet under the tongue 3 (three) times daily as needed (pain).    [provider]  buPROPion (WELLBUTRIN SR) 150 MG 12 hr tablet Take 150 mg by mouth daily.    [provider]  clonazePAM (KLONOPIN) 1 MG tablet Take 1 mg by mouth daily as needed for anxiety.    [provider]  eszopiclone (LUNESTA) 1 MG TABS tablet Take 1 mg by mouth at bedtime. Take immediately before bedtime    [provider]  gabapentin (NEURONTIN) 800 MG tablet Take 800 mg by mouth 3 (three) times daily.  07/12/19 02/12/21  [provider]  ibuprofen (ADVIL) 200 MG tablet Take 800 mg by mouth every 8 (eight) hours as needed for moderate pain.    [provider]  Melatonin 10 MG TABS Take 10 mg by mouth at bedtime.    [provider]  methocarbamol (ROBAXIN) 500 MG tablet Take 1 tablet (500 mg total) by mouth 2 (two) times daily. 02/26/22   Jeannie Fend, PA-C  NARCAN 4 MG/0.1ML LIQD nasal spray kit Place 1 spray into the nose as needed (overdose). 04/30/19   [provider]  pantoprazole (PROTONIX) 40 MG tablet  Take 1 tablet (40 mg total) by mouth 2 (two) times daily. 08/07/19   Rhetta Mura, MD  tiZANidine (ZANAFLEX) 4 MG tablet Take 1 tablet (4 mg total) by mouth every 6 (six) hours as needed for muscle spasms. Patient taking differently: Take 4 mg by mouth every 8 (eight) hours as needed for muscle spasms. 08/30/19   Wallis Bamberg, PA-C  omeprazole (PRILOSEC) 20 MG capsule Take 20 mg by mouth daily.  07/13/19  [provider]                                                                                                                                    Past Surgical History Past Surgical History:  Procedure Laterality Date   APPENDECTOMY     COLONOSCOPY  01/15/2020   poor prep, had to repeat   COLONOSCOPY  02/13/2020   ELBOW SURGERY     right   ESOPHAGOGASTRODUODENOSCOPY  01/2016   for anemia, self reported hematemesis.  Dr Quinn Axe, [facility].  entirly normal study.     ESOPHAGOGASTRODUODENOSCOPY  10/2014   for dark, tarry stools.  Dr Rolland Bimler at [place].  normal EGD.     ESOPHAGOGASTRODUODENOSCOPY  11/2013   Dr Marney Setting at [facility].  for abd pain, n/v.  gastric scar from healed PUD, gastritis.     ESOPHAGOGASTRODUODENOSCOPY  01/2013   Dr Rolland Bimler at [place].  for abd pain.  normal study.  felt to have functional abd pain   ESOPHAGOGASTRODUODENOSCOPY (EGD) WITH PROPOFOL N/A 08/05/2019   Procedure: ESOPHAGOGASTRODUODENOSCOPY (EGD) WITH PROPOFOL;  Surgeon: Charna Elizabeth, MD;  Location: [facility] END[facility]COPY;  Service: Endoscopy;  Laterality: N/A;   EXPLORATORY LAPAROTOMY  2012   for bleeding after lap appy.  at [facility]   FLEXIBLE SIGMOIDOSCOPY  2011   Dr Beverly Gust at [facility].  for reported rectal bleeding.  small, non-bleeding int rrhoids.     HAND SURGERY     LAPAROSCOPIC APPENDECTOMY  2012   at [facility].  bleeding post surgery led to ex lap a few days after appy.     RADIOLOGY WITH ANESTHESIA N/A 01/17/2020   Procedure: MRI WITH ANESTHESIA LUMBAR SPINE WITHOUT CONTRAST;  Surgeon:  Radiologist, Medication, MD;  Location: [facility] OR;  Service: Radiology;  Laterality: N/A;   RADIOLOGY WITH ANESTHESIA N/A 05/27/2020   Procedure: MRI WITH ANESTHESIA LUMBAR WITHOUT CONTRAST AND LEFT HIP WITHOUT CONTRAST;  Surgeon: Radiologist, Medication, MD;  Location: [facility] OR;  Service: Radiology;  Laterality: N/A;   RADIOLOGY WITH ANESTHESIA N/A 02/12/2021   Procedure: MRI WITH ANESTHESIA OF LUMBAR SPINE WITHOUT CONTRAST;  Surgeon: Radiologist, Medication, MD;  Location: [facility] OR;  Service: Radiology;  Laterality: N/A;   SHOULDER SURGERY     SHOULDER SURGERY Bilateral    UPPER GASTROINTESTINAL ENDOSCOPY     Family History Family History  Adopted: Yes    Social History Social History   Tobacco Use   Smoking status: Former    Current packs/day: 0.00    Types: Cigarettes    Quit date: 02/16/2015    Years since quitting: 7.7   Smokeless tobacco: Never  Vaping Use   Vaping status: Every Day   Substances: Nicotine  Substance Use Topics   Alcohol use: Not Currently   Drug use: Not Currently    Types: Heroin, Other-see comments    Comment: 03/30/19 IV Fentanyl.  Been in recovery from heroin for 9 months in the past       Allergies Iodinated contrast media, Shellfish allergy, Vancomycin, Nsaids, Bee venom, Penicillins, Metoclopramide, and Ondansetron  Review of Systems Review of Systems  Respiratory:  Positive for cough.   Gastrointestinal:  Positive for abdominal pain, nausea and vomiting.    Physical Exam Vital Signs  I have reviewed the triage vital signs BP (!) 153/114 (BP Location: Left Arm)   Pulse 83   Temp 98.4 F (36.9 C) (Oral)   Resp 16   Ht 6\' 1"  (1.854 m)   Wt 113.4 kg   SpO2 99%   BMI 32.98 kg/m   Physical Exam Constitutional:      General: He is not in acute distress.    Appearance: Normal appearance.  HENT:     Head: Normocephalic and atraumatic.     Nose: No congestion or rhinorrhea.  Eyes:     General:        Right eye: No discharge.        Left eye:  No discharge.     Extraocular Movements: Extraocular movements intact.     Pupils: Pupils are equal, round, and reactive to light.  Cardiovascular:     Rate and Rhythm: Normal rate and regular rhythm.     Heart sounds: No murmur heard. Pulmonary:     Effort: No respiratory distress.     Breath sounds: No wheezing or rales.  Abdominal:     General: There is no distension.     Tenderness: There is abdominal tenderness in the epigastric area.  Musculoskeletal:        General: Normal range of motion.     Cervical back: Normal range of motion.  Skin:    General: Skin is warm and dry.  Neurological:     General: No focal deficit present.     Mental Status: He is alert.     ED Results and Treatments Labs (all labs ordered are listed, but only abnormal results are displayed) Labs Reviewed  LIPASE, BLOOD  COMPREHENSIVE METABOLIC PANEL  CBC  URINALYSIS, ROUTINE W REFLEX MICROSCOPIC  Radiology No results found.  Pertinent labs & imaging results that were available during my care of the patient were reviewed by me and considered in my medical decision making (see MDM for details).  Medications Ordered in ED Medications  ketamine 50 mg in normal saline 5 mL (10 mg/mL) syringe (has no administration in time range)                                                                                                                                     Procedures Procedures  (including critical care time)  Medical Decision Making / ED Course   This patient presents to the ED for concern of epigastric abdominal pain, hematemesis, this involves an extensive number of treatment options, and is a complaint that carries with it a high risk of complications and morbidity.  The differential diagnosis includes GERD/gastritis, peptic ulcer disease, pancreatitis, gastroparesis,  pneumonia, pleurisy, pericarditis, varices, Mallory-Weiss, gastric AVM  MDM: Patient seen emergency room for evaluation of abdominal pain and hematemesis.  Physical exam with significant tenderness in the epigastrium but is otherwise unremarkable.  Laboratory evaluation with a hemoglobin of 12.9, ALT 55 but is otherwise unremarkable.  Patient requesting HIV testing and this was negative.  Patient with frequent requests for additional pain medication and received multiple doses of pain dose ketamine by patient request, Pepcid, GI cocktail and ultimately did receive a dose of morphine.  Patient had active hematemesis in the emergency department witnessed by this provider.  CT abdomen pelvis is unremarkable outside of previously documented right lower lobe pneumonia and splenomegaly.  I spoke with Dr. Loreta Ave of gastroenterology who states that she will see the patient while inpatient.  I had an extended discussion with the patient about needing to be admitted to the hospital for EGD in the setting of his previous alcohol use and hematemesis.  Similar to previous ER presentations patient is currently refusing admission.  As the patient has been seen in multiple emergency departments across multiple systems, received multiple doses of pain medicine and ask for pain dose ketamine by name, then promptly refuses to be admitted, I do have some suspicion for secondary gain and drug-seeking behavior.  However, I also am concerned for true underlying pathology as he does have risk factors.  I explained the risks of leaving the emergency department which include additional hematemesis, severe morbidity or death and patient voiced understanding of this.  Patient then discharged via inform discharge.  I placed an urgent outpatient gastroenterology referral and gave the patient very strict return precautions.  Patient voiced understanding of this and he was discharged   Additional history obtained:  -External records from  outside source obtained and reviewed including: Chart review including previous notes, labs, imaging, consultation notes   Lab Tests: -I ordered, reviewed, and interpreted labs.   The pertinent results include:   Labs Reviewed  LIPASE, BLOOD  COMPREHENSIVE METABOLIC PANEL  CBC  URINALYSIS, ROUTINE W REFLEX MICROSCOPIC        Imaging Studies ordered: I ordered imaging studies including CTAP I independently visualized and interpreted imaging. I agree with the radiologist interpretation   Medicines ordered and prescription drug management: Meds ordered this encounter  Medications   ketamine 50 mg in normal saline 5 mL (10 mg/mL) syringe    -I have reviewed the patients home medicines and have made adjustments as needed  Critical interventions none  Consultations Obtained: I requested consultation with the gastroenterologist on-call Dr. Loreta Ave,  and discussed lab and imaging findings as well as pertinent plan - they recommend: PPI and hospital admission   Cardiac Monitoring: The patient was maintained on a cardiac monitor.  I personally viewed and interpreted the cardiac monitored which showed an underlying rhythm of: NSR  Social Determinants of Health:  Factors impacting patients care include: Currently in a drug rehab program on Suboxone   Reevaluation: After the interventions noted above, I reevaluated the patient and found that they have :improved  Co morbidities that complicate the patient evaluation  Past Medical History:  Diagnosis Date   Anxiety    Bipolar 2 disorder (HCC)    w depression and PTSD.  [name].     Blood transfusion without reported diagnosis    due to injury in service   Chronic generalized abdominal pain since at least 2013   GI MD felt he has functional abd pain.     Complication of anesthesia    "had issues breathing" "had to be intubated"  "years ago" per pt   Depression    Gastroparesis 2012   Nuc med gastric emptying  study: 12% emptying at 2 hours, normal range is 45 to 79%.   GERD (gastroesophageal reflux disease)    Hepatitis    Hepatitis C   Hepatitis C antibody positive in blood 10/2014   genotype 1a.  no viral counts in care everywhere (as of search 07/2019).     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  Hospital 12/2018 for IV Fentanyl      Dispostion: I considered admission for this patient, and I did encourage the patient to be admitted to the hospital for his hematemesis but he is declining admission and thus he was discharged with close outpatient gastroenterology follow-up of which I placed a referral and strict return precautions     Final Clinical Impression(s) / ED Diagnoses Final diagnoses:  None     @PCDICTATION @    Glendora Score, MD 11/03/22 1757

## 2022-11-08 ENCOUNTER — Emergency Department (HOSPITAL_COMMUNITY): Payer: MEDICAID

## 2022-11-08 ENCOUNTER — Observation Stay (HOSPITAL_COMMUNITY)
Admission: EM | Admit: 2022-11-08 | Discharge: 2022-11-08 | Disposition: A | Discharge status: Home / Self Care | Payer: MEDICAID | Attending: Internal Medicine | Admitting: Internal Medicine

## 2022-11-08 DIAGNOSIS — I10 Essential (primary) hypertension: Secondary | ICD-10-CM | POA: Diagnosis not present

## 2022-11-08 DIAGNOSIS — K920 Hematemesis: Secondary | ICD-10-CM | POA: Diagnosis not present

## 2022-11-08 DIAGNOSIS — R112 Nausea with vomiting, unspecified: Secondary | ICD-10-CM | POA: Diagnosis present

## 2022-11-08 LAB — CBC WITH DIFFERENTIAL/PLATELET
Abs Immature Granulocytes: 0.02 10*3/uL (ref 0.00–0.07)
Basophils Absolute: 0 10*3/uL (ref 0.0–0.1)
Basophils Relative: 1 %
Eosinophils Absolute: 0.2 10*3/uL (ref 0.0–0.5)
Eosinophils Relative: 3 %
HCT: 41.1 % (ref 39.0–52.0)
Hemoglobin: 13.2 g/dL (ref 13.0–17.0)
Immature Granulocytes: 0 %
Lymphocytes Relative: 29 %
Lymphs Abs: 1.8 10*3/uL (ref 0.7–4.0)
MCH: 27.7 pg (ref 26.0–34.0)
MCHC: 32.1 g/dL (ref 30.0–36.0)
MCV: 86.3 fL (ref 80.0–100.0)
Monocytes Absolute: 0.5 10*3/uL (ref 0.1–1.0)
Monocytes Relative: 7 %
Neutro Abs: 3.8 10*3/uL (ref 1.7–7.7)
Neutrophils Relative %: 60 %
Platelets: 314 10*3/uL (ref 150–400)
RBC: 4.76 MIL/uL (ref 4.22–5.81)
RDW: 12.7 % (ref 11.5–15.5)
WBC: 6.3 10*3/uL (ref 4.0–10.5)
nRBC: 0 % (ref 0.0–0.2)

## 2022-11-08 LAB — COMPREHENSIVE METABOLIC PANEL
ALT: 50 U/L — ABNORMAL HIGH (ref 0–44)
AST: 40 U/L (ref 15–41)
Albumin: 3.9 g/dL (ref 3.5–5.0)
Alkaline Phosphatase: 68 U/L (ref 38–126)
Anion gap: 8 (ref 5–15)
BUN: 19 mg/dL (ref 6–20)
CO2: 23 mmol/L (ref 22–32)
Calcium: 9 mg/dL (ref 8.9–10.3)
Chloride: 108 mmol/L (ref 98–111)
Creatinine, Ser: 0.79 mg/dL (ref 0.61–1.24)
GFR, Estimated: >60 mL/min (ref >60)
Glucose, Bld: 103 mg/dL — ABNORMAL HIGH (ref 70–99)
Potassium: 4.4 mmol/L (ref 3.5–5.1)
Sodium: 139 mmol/L (ref 135–145)
Total Bilirubin: 0.5 mg/dL (ref 0.3–1.2)
Total Protein: 8.3 g/dL — ABNORMAL HIGH (ref 6.5–8.1)

## 2022-11-08 LAB — ETHANOL: Alcohol, Ethyl (B): <10 mg/dL (ref <10)

## 2022-11-08 LAB — LIPASE, BLOOD: Lipase: 21 U/L (ref 11–51)

## 2022-11-08 MED ORDER — SODIUM CHLORIDE 0.9 % IV BOLUS
1000.0000 mL | Freq: Once | INTRAVENOUS | Status: AC
  Administered 2022-11-08: 1000 mL via INTRAVENOUS

## 2022-11-08 MED ORDER — SODIUM CHLORIDE 0.45 % IV SOLN: INTRAVENOUS | Status: DC

## 2022-11-08 MED ORDER — FAMOTIDINE IN NACL 20-0.9 MG/50ML-% IV SOLN: 20.0000 mg | Freq: Once | INTRAVENOUS | Status: DC

## 2022-11-08 MED ORDER — PROCHLORPERAZINE EDISYLATE 10 MG/2ML IJ SOLN
10.0000 mg | Freq: Once | INTRAMUSCULAR | Status: AC
  Administered 2022-11-08: 10 mg via INTRAVENOUS
  Filled 2022-11-08: qty 2

## 2022-11-08 MED ORDER — SODIUM CHLORIDE 0.9 % IV SOLN
12.5000 mg | Freq: Four times a day (QID) | INTRAVENOUS | Status: DC | PRN
  Administered 2022-11-08: 12.5 mg via INTRAVENOUS
  Filled 2022-11-08: qty 12.5

## 2022-11-08 MED ORDER — ACETAMINOPHEN 650 MG RE SUPP: 650.0000 mg | Freq: Four times a day (QID) | RECTAL | Status: DC | PRN

## 2022-11-08 MED ORDER — PANTOPRAZOLE SODIUM 40 MG IV SOLR: 40.0000 mg | Freq: Two times a day (BID) | INTRAVENOUS | Status: DC

## 2022-11-08 MED ORDER — ACETAMINOPHEN 325 MG PO TABS: 650.0000 mg | ORAL_TABLET | Freq: Four times a day (QID) | ORAL | Status: DC | PRN

## 2022-11-08 MED ORDER — FENTANYL CITRATE PF 50 MCG/ML IJ SOSY
50.0000 ug | PREFILLED_SYRINGE | Freq: Once | INTRAMUSCULAR | Status: AC
  Administered 2022-11-08: 50 ug via INTRAVENOUS
  Filled 2022-11-08: qty 1

## 2022-11-08 MED ORDER — HYDRALAZINE HCL 20 MG/ML IJ SOLN
20.0000 mg | Freq: Once | INTRAMUSCULAR | Status: AC
  Administered 2022-11-08: 20 mg via INTRAVENOUS
  Filled 2022-11-08: qty 1

## 2022-11-08 MED ORDER — PANTOPRAZOLE 80MG IVPB - SIMPLE MED
80.0000 mg | Freq: Once | INTRAVENOUS | Status: AC
  Administered 2022-11-08: 80 mg via INTRAVENOUS
  Filled 2022-11-08: qty 80

## 2022-11-08 MED ORDER — MIDAZOLAM HCL 2 MG/2ML IJ SOLN
2.0000 mg | Freq: Once | INTRAMUSCULAR | Status: AC
  Administered 2022-11-08: 2 mg via INTRAVENOUS
  Filled 2022-11-08: qty 2

## 2022-11-08 MED ORDER — PANTOPRAZOLE INFUSION (NEW) - SIMPLE MED
8.0000 mg/h | INTRAVENOUS | Status: DC
  Filled 2022-11-08: qty 100

## 2022-11-08 NOTE — ED Provider Notes (Signed)
[place] EMERGENCY DEPARTMENT AT [facility] Provider Note   CSN: [id number] Arrival date & time: 11/08/22  8295     History  Chief Complaint  Patient presents with   Hematemesis    Walter Kemp is a 32 y.o. male presented to the ED with complaint of persistent nausea, vomiting, epigastric pain.  Patient was seen in the ED approximately 5 days ago for similar complaints, including hematemesis.  He has CT imaging and labs at that time.  He was recommended for admission but ultimately had to go home because he reports "I had to pick up my daughter, there was no one to take care of my dog, and I just could not do it, I had to go to work".  He presents back to the ED and is not willing to stay in the hospital as needed.  He reports he is a difficult keep down any fluids or any of his medications including his Klonopin.  He is on Suboxone for opioid use disorder.  He denies alcohol use for the past 6 years reports he is sober.  He is been admitted to the past for acute pancreatitis  CT abdomen pelvis performed on 918 per my review of the records showed concerning multifocal infection findings of the right lung and splenomegaly, with prominent portal hepatis lymph nodes, unchanged from prior imaging  Dr Loreta Ave performed last EGD on 08/05/19 with grade 1 distal esophagitis findings, no emergent findings of the stomach or duodenum  HPI     Home Medications Prior to Admission medications   Medication Sig Start Date End Date Taking? Authorizing Provider  acetaminophen (TYLENOL) 500 MG tablet Take 1,000 mg by mouth every 6 (six) hours as needed for moderate pain.    [provider]  ARIPiprazole (ABILIFY) 10 MG tablet Take 10 mg by mouth daily.    [provider]  buprenorphine-naloxone (SUBOXONE) 8-2 mg SUBL SL tablet Place 1 tablet under the tongue 3 (three) times daily as needed (pain).    [provider]  buPROPion (WELLBUTRIN SR) 150 MG 12 hr tablet  Take 150 mg by mouth daily.    [provider]  clonazePAM (KLONOPIN) 1 MG tablet Take 1 mg by mouth daily as needed for anxiety.    [provider]  eszopiclone (LUNESTA) 1 MG TABS tablet Take 1 mg by mouth at bedtime. Take immediately before bedtime    [provider]  gabapentin (NEURONTIN) 800 MG tablet Take 800 mg by mouth 3 (three) times daily.  07/12/19 02/12/21  [provider]  ibuprofen (ADVIL) 200 MG tablet Take 800 mg by mouth every 8 (eight) hours as needed for moderate pain.    [provider]  Melatonin 10 MG TABS Take 10 mg by mouth at bedtime.    [provider]  methocarbamol (ROBAXIN) 500 MG tablet Take 1 tablet (500 mg total) by mouth 2 (two) times daily. 02/26/22   Jeannie Fend, PA-C  NARCAN 4 MG/0.1ML LIQD nasal spray kit Place 1 spray into the nose as needed (overdose). 04/30/19   [provider]  pantoprazole (PROTONIX) 40 MG tablet Take 1 tablet (40 mg total) by mouth 2 (two) times daily. 11/03/22   Kommor, Madison, MD  tiZANidine (ZANAFLEX) 4 MG tablet Take 1 tablet (4 mg total) by mouth every 6 (six) hours as needed for muscle spasms. Patient taking differently: Take 4 mg by mouth every 8 (eight) hours as needed for muscle spasms. 08/30/19   Urban Gibson,  Marquita Palms, PA-C  omeprazole (PRILOSEC) 20 MG capsule Take 20 mg by mouth daily.  07/13/19  [provider]      Allergies    Iodinated contrast media, Shellfish allergy, Vancomycin, Nsaids, Bee venom, Penicillins, Metoclopramide, and Ondansetron    Review of Systems   Review of Systems  Physical Exam Updated Vital Signs BP (!) 165/122 (BP Location: Right Arm)   Pulse 99   Temp 98.4 F (36.9 C) (Oral)   Resp 17   SpO2 98%  Physical Exam Constitutional:      General: He is not in acute distress. HENT:     Head: Normocephalic and atraumatic.  Eyes:     Conjunctiva/sclera: Conjunctivae normal.     Pupils: Pupils are equal, round, and reactive to light.   Cardiovascular:     Rate and Rhythm: Regular rhythm. Tachycardia present.  Pulmonary:     Effort: Pulmonary effort is normal. No respiratory distress.  Abdominal:     General: There is no distension.     Tenderness: There is no abdominal tenderness.  Skin:    General: Skin is warm and dry.  Neurological:     General: No focal deficit present.     Mental Status: He is alert. Mental status is at baseline.  Psychiatric:        Mood and Affect: Mood normal.        Behavior: Behavior normal.     ED Results / Procedures / Treatments   Labs (all labs ordered are listed, but only abnormal results are displayed) Labs Reviewed  COMPREHENSIVE METABOLIC PANEL - Abnormal; Notable for the following components:      Result Value   Glucose, Bld 103 (*)    Total Protein 8.3 (*)    ALT 50 (*)    All other components within normal limits  CBC WITH DIFFERENTIAL/PLATELET  LIPASE, BLOOD  ETHANOL  RAPID URINE DRUG SCREEN, HOSP PERFORMED  MAGNESIUM  PHOSPHORUS  HEMOGLOBIN AND HEMATOCRIT, BLOOD  HEMOGLOBIN AND HEMATOCRIT, BLOOD  PROTIME-INR  HEMOGLOBIN AND HEMATOCRIT, BLOOD  CBC  COMPREHENSIVE METABOLIC PANEL  TYPE AND SCREEN    EKG None  Radiology DG Chest 2 View  Result Date: 11/08/2022 CLINICAL DATA:  Cough.  Evaluate for pneumonia. EXAM: CHEST - 2 VIEW COMPARISON:  Chest radiograph 08/05/2019. Lung bases from abdominal CT 11/03/2022 FINDINGS: No radiographic correlate for the ground-glass opacities at the right lung base on prior CT. There is no confluent airspace disease. The heart is normal in size. Mediastinal contours are normal. No pleural effusion or pneumothorax. No pulmonary edema. No acute osseous findings. IMPRESSION: No radiographic correlate for the ground-glass opacities at the right lung base on prior CT. No confluent airspace disease. Electronically Signed   By: Narda Rutherford M.D.   On: 11/08/2022 13:19    Procedures Procedures    Medications Ordered in  ED Medications  promethazine (PHENERGAN) 12.5 mg in sodium chloride 0.9 % 50 mL IVPB (0 mg Intravenous Stopped 11/08/22 1535)  pantoprozole (PROTONIX) 80 mg /NS 100 mL infusion (has no administration in time range)  pantoprazole (PROTONIX) injection 40 mg (has no administration in time range)  acetaminophen (TYLENOL) tablet 650 mg (has no administration in time range)    Or  acetaminophen (TYLENOL) suppository 650 mg (has no administration in time range)  0.45 % sodium chloride infusion (0 mLs Intravenous Stopped 11/08/22 1535)  sodium chloride 0.9 % bolus 1,000 mL (0 mLs Intravenous Stopped 11/08/22 1534)  midazolam (VERSED) injection 2 mg (2 mg  Intravenous Given 11/08/22 1130)  hydrALAZINE (APRESOLINE) injection 20 mg (20 mg Intravenous Given 11/08/22 1445)  pantoprazole (PROTONIX) 80 mg /NS 100 mL IVPB (0 mg Intravenous Stopped 11/08/22 1535)  prochlorperazine (COMPAZINE) injection 10 mg (10 mg Intravenous Given 11/08/22 1445)  fentaNYL (SUBLIMAZE) injection 50 mcg (50 mcg Intravenous Given 11/08/22 1454)    ED Course/ Medical Decision Making/ A&P Clinical Course as of 11/08/22 1741  Mon Nov 08, 2022  1221 Dr Loreta Ave consulted [MT]    Clinical Course User Index [MT] Renaye Rakers Kermit Balo, MD                                 Medical Decision Making Amount and/or Complexity of Data Reviewed Labs: ordered. Radiology: ordered.  Risk Prescription drug management. Decision regarding hospitalization.   This patient presents to the ED with concern for nausea, vomiting, epigastric pain. This involves an extensive number of treatment options, and is a complaint that carries with it a high risk of complications and morbidity.  The differential diagnosis includes gastritis versus esophagitis versus other  Patient shows me a photo of large amount frank hematemesis from earlier this morning or last night.  He reports his symptoms have not improved since his visit to the ED 5 days ago.  Therefore I have  ordered repeat blood test.  I do not believe he needs another CT scan of the abdomen at this time, but an x-ray of the chest to be reasonable as there was concern for multifocal pneumonia developing on his last CT abdomen pelvis performed 5 days ago.  Query aspiration?  External records from outside source obtained and reviewed including EGD report from 2021  I ordered and personally interpreted labs.  The pertinent results include: Labs are unremarkable  I ordered imaging studies including x-ray of the chest I independently visualized and interpreted imaging which showed no emergent findings; overall improvement of opacities noted on prior imaging I agree with the radiologist interpretation  The patient was maintained on a cardiac monitor.    I ordered medication including IV Versed as the patient reports he has not been able to keep down his Klonopin.  IV Phenergan as the patient reports adverse reactions to Zofran, but not Phenergan.  IV fluids for hydration.  The patient is not wanting any opioid medications.  I did offer him his buprenorphine medicine but he says he does not want to take it because "that makes me feel even sicker".  We did discuss ketamine which he has been given in the past, but I made it clear I do not think this is an appropriate medication to be giving repeatedly in the emergency departments for pain control and "anxiety."  I discussed my concern about dependency and addiction concerns, given his prior history, blood pressure elevation (he already has significant hypertension), stroke risk, and aspiration risk on dissociative medications.  He verbalized understanding.  I have reviewed the patients home medicines and have made adjustments as needed  Test Considered: Lower suspicion for acute PE in this clinical setting.  This does not appear to be hemoptysis.  I do not see an indication for CT angiogram of the chest  I requested consultation with the GI doctor,  and  discussed lab and imaging findings as well as pertinent plan - they recommend: consult pending on admission  Social Determinants of Health: the patient reports he is able and willing to stay in the hospital  now to pursue workup as needed.  Dispostion:  After consideration of the diagnostic results and the patients response to treatment, I feel that the patent would benefit from medical admission, GI consultation.         Final Clinical Impression(s) / ED Diagnoses Final diagnoses:  Hematemesis with nausea  Hypertension, unspecified type    Rx / DC Orders ED Discharge Orders     None         Lorree Millar, Kermit Balo, MD 11/08/22 [phone]

## 2022-11-08 NOTE — ED Triage Notes (Signed)
Pt seen Thurs for coughing up blood, supposed to be admitted but could not stay. Now reports coughing up black. Reports abd pain, bile and black blood emesis after eating.

## 2022-11-08 NOTE — ED Notes (Signed)
Pt is constantly requesting pain meds. Pt was given Versed and Fentanyl but stated it does not work for him soon after administering the medication. Pt requested ketamine specifically and stated he will leave if he wasn't given the medication. MD stated that pt can not get med d/t BP was already high. After being told he would not receive ketamine, pt left AMA.

## 2022-11-08 NOTE — Progress Notes (Signed)
[facility] admitting team.      Patient: Walter Kemp VHQ:[id number] DOB: May 07, 1990 DOA: 11/08/2022 DOS: the patient was seen and examined on 11/08/2022 PCP: Jordan Hawks, PA-C  Patient coming from: Home  Chief Complaint:  Chief Complaint  Patient presents with   Hematemesis   HPI: Walter Kemp is a 32 y.o. male with below past medical history who presented to the emergency department with complaints of abdominal pain and hematemesis after eating.  He was seen 5 days ago with similar complaints but he left AMA.  Today he exhibited significant drug-seeking behavior.  Despite being administered midazolam and fentanyl, he kept frequently insisting about getting ketamine IV and threatening to leave AMA if this was not done.  His blood pressure was high and there was no indication for ketamine for his symptoms.  Besides I am not credentialed to order it.  He subsequently left AMA.  Past Medical History:  Diagnosis Date   Anxiety    Bipolar 2 disorder (HCC)    w depression and PTSD.  [name].     Blood transfusion without reported diagnosis    due to injury in service   Chronic generalized abdominal pain since at least 2013   GI MD felt he has functional abd pain.     Complication of anesthesia    "had issues breathing" "had to be intubated"  "years ago" per pt   Depression    Gastroparesis 2012   Nuc med gastric emptying study: 12% emptying at 2 hours, normal range is 45 to 79%.   GERD (gastroesophageal reflux disease)    Hepatitis    Hepatitis C   Hepatitis C antibody positive in blood 10/2014   genotype 1a.  no viral counts in care everywhere (as of search 07/2019).     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  Hospital 12/2018 for IV  Fentanyl   Past Surgical History:  Procedure Laterality Date   APPENDECTOMY     COLONOSCOPY  01/15/2020   poor prep, had to repeat   COLONOSCOPY  02/13/2020   ELBOW SURGERY     right   ESOPHAGOGASTRODUODENOSCOPY  01/2016   for anemia, self reported hematemesis.  Dr Quinn Axe, [facility].  entirly normal study.     ESOPHAGOGASTRODUODENOSCOPY  10/2014   for dark, tarry stools.  Dr Rolland Bimler at [place].  normal EGD.     ESOPHAGOGASTRODUODENOSCOPY  11/2013   Dr Marney Setting at [place].  for abd pain, n/v.  gastric scar from healed PUD, gastritis.     ESOPHAGOGASTRODUODENOSCOPY  01/2013   Dr Rolland Bimler at [place].  for abd pain.  normal study.  felt to have functional abd pain   ESOPHAGOGASTRODUODENOSCOPY (EGD) WITH PROPOFOL N/A 08/05/2019   Procedure: ESOPHAGOGASTRODUODENOSCOPY (EGD) WITH PROPOFOL;  Surgeon: Charna Elizabeth, MD;  Location: [facility] END[facility]COPY;  Service: Endoscopy;  Laterality: N/A;   EXPLORATORY LAPAROTOMY  2012   for bleeding after lap appy.  at [facility]   FLEXIBLE SIGMOIDOSCOPY  2011   Dr Beverly Gust at [facility].  for reported rectal bleeding.  small, non-bleeding int rrhoids.     HAND SURGERY     LAPAROSCOPIC APPENDECTOMY  2012   at [facility].  bleeding post surgery led to  ex lap a few days after appy.     RADIOLOGY WITH ANESTHESIA N/A 01/17/2020   Procedure: MRI WITH ANESTHESIA LUMBAR SPINE WITHOUT CONTRAST;  Surgeon: Radiologist, Medication, MD;  Location: [facility] OR;  Service: Radiology;  Laterality: N/A;   RADIOLOGY WITH ANESTHESIA N/A 05/27/2020   Procedure: MRI WITH ANESTHESIA LUMBAR WITHOUT CONTRAST AND LEFT HIP WITHOUT CONTRAST;  Surgeon: Radiologist, Medication, MD;  Location: [facility] OR;  Service: Radiology;  Laterality: N/A;   RADIOLOGY WITH ANESTHESIA N/A 02/12/2021   Procedure: MRI WITH ANESTHESIA OF LUMBAR SPINE WITHOUT CONTRAST;  Surgeon: Radiologist, Medication, MD;  Location: [facility] OR;  Service: Radiology;  Laterality: N/A;   SHOULDER SURGERY     SHOULDER SURGERY Bilateral    UPPER  GASTROINTESTINAL ENDOSCOPY     Social History:  reports that he quit smoking about 7 years ago. His smoking use included cigarettes. He has never used smokeless tobacco. He reports that he does not currently use alcohol. He reports that he does not currently use drugs after having used the following drugs: Heroin and Other-see comments.  Allergies  Allergen Reactions   Iodinated Contrast Media Hives, Itching and Rash   Shellfish Allergy Anaphylaxis   Vancomycin Anaphylaxis and Hives    Throat Swelling per patient    Nsaids Nausea And Vomiting and Other (See Comments)    GI distress    Bee Venom Swelling   Penicillins Other (See Comments)    Childhood/ unknown   Metoclopramide Diarrhea   Ondansetron Palpitations    Family History  Adopted: Yes    Prior to Admission medications   Medication Sig Start Date End Date Taking? Authorizing Provider  acetaminophen (TYLENOL) 500 MG tablet Take 1,000 mg by mouth every 6 (six) hours as needed for moderate pain.    [provider]  ARIPiprazole (ABILIFY) 10 MG tablet Take 10 mg by mouth daily.    [provider]  buprenorphine-naloxone (SUBOXONE) 8-2 mg SUBL SL tablet Place 1 tablet under the tongue 3 (three) times daily as needed (pain).    [provider]  buPROPion (WELLBUTRIN SR) 150 MG 12 hr tablet Take 150 mg by mouth daily.    [provider]  clonazePAM (KLONOPIN) 1 MG tablet Take 1 mg by mouth daily as needed for anxiety.    [provider]  eszopiclone (LUNESTA) 1 MG TABS tablet Take 1 mg by mouth at bedtime. Take immediately before bedtime    [provider]  gabapentin (NEURONTIN) 800 MG tablet Take 800 mg by mouth 3 (three) times daily.  07/12/19 02/12/21  [provider]  ibuprofen (ADVIL) 200 MG tablet Take 800 mg by mouth every 8 (eight) hours as needed for moderate pain.    [provider]  Melatonin 10 MG TABS Take 10 mg by mouth at bedtime.    [provider]  methocarbamol (ROBAXIN) 500 MG tablet Take 1 tablet (500 mg total) by mouth 2 (two) times daily. 02/26/22   Jeannie Fend, PA-C  NARCAN 4 MG/0.1ML LIQD nasal spray kit Place 1 spray into the nose as needed (overdose). 04/30/19   [provider]  pantoprazole (PROTONIX) 40 MG tablet Take 1 tablet (40 mg total) by mouth 2 (two) times daily. 11/03/22   Kommor, Madison, MD  tiZANidine (ZANAFLEX) 4 MG tablet Take 1 tablet (4 mg total) by mouth every 6 (six) hours as needed for muscle spasms. Patient taking differently: Take 4 mg by mouth every 8 (eight) hours as needed for muscle spasms. 08/30/19  Wallis Bamberg, PA-C  omeprazole (PRILOSEC) 20 MG capsule Take 20 mg by mouth daily.  07/13/19  [provider]    Physical Exam: Vitals:   11/08/22 0913 11/08/22 1249  BP: (!) 174/125 (!) 165/122  Pulse: 99 99  Resp: 16 17  Temp: 98.4 F (36.9 C) 98.4 F (36.9 C)  TempSrc: Oral Oral  SpO2: 98% 98%   Author: Bobette Mo, MD 11/08/2022 1:35 PM  For on call review [email or web address].   This document was prepared using Dragon voice recognition software and may contain some unintended transcription errors.

## 2022-11-08 NOTE — ED Notes (Signed)
ED TO INPATIENT HANDOFF REPORT  Name/Age/Gender Walter Kemp 32 y.o. male  Code Status    Code Status Orders  (From admission, onward)           Start     Ordered   11/08/22 1350  Full code  Continuous       Question:  By:  Answer:  Consent: discussion documented in EHR   11/08/22 1350           Code Status History     Date Active Date Inactive Code Status Order ID Comments User Context   08/05/2019 1400 08/07/2019 1856 Full Code [id number]  Briscoe Deutscher, MD Inpatient       Home/SNF/Other Home  Chief Complaint Hematemesis [K92.0]  Level of Care/Admitting Diagnosis ED Disposition     ED Disposition  Admit   Condition  --   Comment  Hospital Area: [facility] [[id number]]  Level of Care: Progressive [102]  Admit to Progressive based on following criteria: GI, ENDOCRINE disease patients with GI bleeding, acute liver failure or pancreatitis, stable with diabetic ketoacidosis or thyrotoxicosis (hypothyroid) state.  May place patient in observation at [facility] or [facility] if equivalent level of care is available:: No  Covid Evaluation: Asymptomatic - no recent exposure (last 10 days) testing not required  Diagnosis: Hematemesis [578.0.ICD-9-CM]  Admitting Physician: Bobette Mo [[id number]]  Attending Physician: Bobette Mo [[id number]]          Medical History Past Medical History:  Diagnosis Date   Anxiety    Bipolar 2 disorder (HCC)    w depression and PTSD.  [name].     Blood transfusion without reported diagnosis    due to injury in service   Chronic generalized abdominal pain since at least 2013   GI MD felt he has functional abd pain.     Complication of anesthesia    "had issues breathing" "had to be intubated"  "years ago" per pt   Depression    Gastroparesis 2012   Nuc med gastric emptying study: 12% emptying at 2 hours, normal range is 45 to 79%.   GERD (gastroesophageal reflux disease)     Hepatitis    Hepatitis C   Hepatitis C antibody positive in blood 10/2014   genotype 1a.  no viral counts in care everywhere (as of search 07/2019).     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  Hospital 12/2018 for IV Fentanyl    Allergies Allergies  Allergen Reactions   Iodinated Contrast Media Hives, Itching and Rash   Shellfish Allergy Anaphylaxis   Vancomycin Anaphylaxis and Hives    Throat Swelling per patient    Nsaids Nausea And Vomiting and Other (See Comments)    GI distress    Bee Venom Swelling   Penicillins Other (See Comments)    Childhood/ unknown   Metoclopramide Diarrhea   Ondansetron Palpitations    IV Location/Drains/Wounds Patient Lines/Drains/Airways Status     Active Line/Drains/Airways     Name Placement date Placement time Site Days   Peripheral IV 11/08/22 20 G 1.88" Anterior;Left Forearm 11/08/22  1135  Forearm  less than 1  Labs/Imaging Results for orders placed or performed during the hospital encounter of 11/08/22 (from the past 48 hour(s))  Comprehensive metabolic panel     Status: Abnormal   Collection Time: 11/08/22 11:35 AM  Result Value Ref Range   Sodium 139 135 - 145 mmol/L   Potassium 4.4 3.5 - 5.1 mmol/L   Chloride 108 98 - 111 mmol/L   CO2 23 22 - 32 mmol/L   Glucose, Bld 103 (H) 70 - 99 mg/dL    Comment: Glucose reference range applies only to samples taken after fasting for at least 8 hours.   BUN 19 6 - 20 mg/dL   Creatinine, Ser 1.61 0.61 - 1.24 mg/dL   Calcium 9.0 8.9 - 09.6 mg/dL   Total Protein 8.3 (H) 6.5 - 8.1 g/dL   Albumin 3.9 3.5 - 5.0 g/dL   AST 40 15 - 41 U/L   ALT 50 (H) 0 - 44 U/L   Alkaline Phosphatase 68 38 - 126 U/L   Total Bilirubin 0.5 0.3 - 1.2 mg/dL    GFR, Estimated >04 >54 mL/min    Comment: (NOTE) Calculated using the CKD-EPI Creatinine Equation (2021)    Anion gap 8 5 - 15    Comment: Performed at [facility], [address], [place], [place] [address]  CBC with Differential     Status: None   Collection Time: 11/08/22 11:35 AM  Result Value Ref Range   WBC 6.3 4.0 - 10.5 K/uL   RBC 4.76 4.22 - 5.81 MIL/uL   Hemoglobin 13.2 13.0 - 17.0 g/dL   HCT 91.4 78.2 - 95.6 %   MCV 86.3 80.0 - 100.0 fL   MCH 27.7 26.0 - 34.0 pg   MCHC 32.1 30.0 - 36.0 g/dL   RDW 21.3 08.6 - 57.8 %   Platelets 314 150 - 400 K/uL   nRBC 0.0 0.0 - 0.2 %   Neutrophils Relative % 60 %   Neutro Abs 3.8 1.7 - 7.7 K/uL   Lymphocytes Relative 29 %   Lymphs Abs 1.8 0.7 - 4.0 K/uL   Monocytes Relative 7 %   Monocytes Absolute 0.5 0.1 - 1.0 K/uL   Eosinophils Relative 3 %   Eosinophils Absolute 0.2 0.0 - 0.5 K/uL   Basophils Relative 1 %   Basophils Absolute 0.0 0.0 - 0.1 K/uL   Immature Granulocytes 0 %   Abs Immature Granulocytes 0.02 0.00 - 0.07 K/uL    Comment: Performed at [facility], [address], [place], [place] [address]  Lipase, blood     Status: None   Collection Time: 11/08/22 11:35 AM  Result Value Ref Range   Lipase 21 11 - 51 U/L    Comment: Performed at [facility], [address], [place], [place] [address]  Ethanol     Status: None   Collection Time: 11/08/22 11:35 AM  Result Value Ref Range   Alcohol, Ethyl (B) <10 <10 mg/dL    Comment: (NOTE) Lowest detectable limit for serum alcohol is 10 mg/dL.  For medical purposes only. Performed at [facility], [address], [place], [place] [address]    DG Chest 2 View  Result Date: 11/08/2022 CLINICAL DATA:  Cough.  Evaluate for pneumonia. EXAM: CHEST - 2 VIEW COMPARISON:  Chest radiograph 08/05/2019. Lung bases from abdominal CT 11/03/2022 FINDINGS: No radiographic correlate for the ground-glass  opacities at the right lung base on prior CT. There is no confluent airspace disease.  The heart is normal in size. Mediastinal contours are normal. No pleural effusion or pneumothorax. No pulmonary edema. No acute osseous findings. IMPRESSION: No radiographic correlate for the ground-glass opacities at the right lung base on prior CT. No confluent airspace disease. Electronically Signed   By: Narda Rutherford M.D.   On: 11/08/2022 13:19    Pending Labs Unresulted Labs (From admission, onward)     Start     Ordered   11/09/22 0500  CBC  Tomorrow morning,   R        11/08/22 1350   11/09/22 0500  Comprehensive metabolic panel  Tomorrow morning,   R        11/08/22 1350   11/08/22 1600  Hemoglobin and hematocrit, blood  Every 6 hours,   R (with TIMED occurrences)      11/08/22 1339   11/08/22 1600  [facility] and screen  [facility]  Once,   R       Comments: [facility]    11/08/22 1339   11/08/22 1600  Protime-INR  Once-Timed,   TIMED        11/08/22 1350   11/08/22 1337  Magnesium  Add-on,   AD        11/08/22 1336   11/08/22 1337  Phosphorus  Add-on,   AD        11/08/22 1336   11/08/22 1019  Rapid urine drug screen (hospital performed)  ONCE - STAT,   STAT        11/08/22 1018            Vitals/Pain Today's Vitals   11/08/22 0913 11/08/22 0918 11/08/22 1249  BP: (!) 174/125  (!) 165/122  Pulse: 99  99  Resp: 16  17  Temp: 98.4 F (36.9 C)  98.4 F (36.9 C)  TempSrc: Oral  Oral  SpO2: 98%  98%  PainSc:  8      Isolation Precautions No active isolations  Medications Medications  promethazine (PHENERGAN) 12.5 mg in sodium chloride 0.9 % 50 mL IVPB (12.5 mg Intravenous New Bag/Given 11/08/22 1159)  pantoprazole (PROTONIX) 80 mg /NS 100 mL IVPB (80 mg Intravenous New Bag/Given 11/08/22 1448)  pantoprozole (PROTONIX) 80 mg /NS 100 mL infusion (has no administration in time range)  pantoprazole (PROTONIX) injection 40 mg (has no  administration in time range)  acetaminophen (TYLENOL) tablet 650 mg (has no administration in time range)    Or  acetaminophen (TYLENOL) suppository 650 mg (has no administration in time range)  0.45 % sodium chloride infusion ( Intravenous New Bag/Given 11/08/22 1450)  sodium chloride 0.9 % bolus 1,000 mL (1,000 mLs Intravenous New Bag/Given 11/08/22 1130)  midazolam (VERSED) injection 2 mg (2 mg Intravenous Given 11/08/22 1130)  hydrALAZINE (APRESOLINE) injection 20 mg (20 mg Intravenous Given 11/08/22 1445)  prochlorperazine (COMPAZINE) injection 10 mg (10 mg Intravenous Given 11/08/22 1445)  fentaNYL (SUBLIMAZE) injection 50 mcg (50 mcg Intravenous Given 11/08/22 1454)    Mobility walks

## 2022-11-08 NOTE — Consult Note (Addendum)
UNASSIGNED PATIENT Reason for Consult: Hematemesis with severe abdominal pain. Referring Physician: [facility] hospitalist  Walter Kemp is an 32 y.o. male.  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 abdominal pain with hematemesis since last week.  He was in the emergency room 11/03/2022 when a GI consultation was requested but before I could see him he left the ER AMA. He complains of severe abdominal pain and is requesting Ketamine for symptomatic relief as he is very not anxious and Ketamine is the only medication that seems to help him calm down.  Patient was not able to cooperate with me giving much in the way of history but he says he threw some up some bright red blood in the commode in the emergency room today and showed me a picture of that on his phone. He gives a history of peptic ulcer disease in the past. He has used nonsteroidals about 2 weeks ago for sprained ankle but did not usually use nonsteroidals on a regular basis. He denies any recent use of alcohol and states since he has last bout of alcoholic pancreatitis several years ago he has not had any alcohol to drink.  Past Medical History:  Diagnosis Date   Anxiety    Bipolar 2 disorder (HCC)    w depression and PTSD.  [name].     Blood transfusion without reported diagnosis    due to injury in service   Chronic generalized abdominal pain since at least 2013   GI MD felt he has functional abd pain.     Complication of anesthesia    "had issues breathing" "had to be intubated"  "years ago" per pt   Depression    Gastroparesis 2012   Nuc med gastric emptying study: 12% emptying at 2 hours, normal range is 45 to 79%.   GERD (gastroesophageal reflux disease)    Hepatitis    Hepatitis C   Hepatitis C antibody positive in blood 10/2014   genotype 1a.  no viral counts in care everywhere (as of search 07/2019).      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  Hospital 12/2018 for IV Fentanyl   Past Surgical History:  Procedure Laterality Date   APPENDECTOMY     COLONOSCOPY  01/15/2020   poor prep, had to repeat   COLONOSCOPY  02/13/2020   ELBOW SURGERY     right   ESOPHAGOGASTRODUODENOSCOPY  01/2016   for anemia, self reported hematemesis.  Dr Quinn Axe, [facility].  entirly normal study.     ESOPHAGOGASTRODUODENOSCOPY  10/2014   for dark, tarry stools.  Dr Rolland Bimler at [place].  normal EGD.     ESOPHAGOGASTRODUODENOSCOPY  11/2013   Dr Marney Setting at [place].  for abd pain, n/v.  gastric scar from healed PUD, gastritis.     ESOPHAGOGASTRODUODENOSCOPY  01/2013   Dr Rolland Bimler at [place].  for abd pain.  normal study.  felt to have functional abd pain   ESOPHAGOGASTRODUODENOSCOPY (EGD) WITH PROPOFOL N/A 08/05/2019   Procedure: ESOPHAGOGASTRODUODENOSCOPY (EGD) WITH PROPOFOL;  Surgeon: Charna Elizabeth, MD;  Location: [facility];  Service: Endoscopy;  Laterality: N/A;  EXPLORATORY LAPAROTOMY  2012   for bleeding after lap appy.  at [facility]   FLEXIBLE SIGMOIDOSCOPY  2011   Dr Beverly Gust at [facility].  for reported rectal bleeding.  small, non-bleeding int rrhoids.     HAND SURGERY     LAPAROSCOPIC APPENDECTOMY  2012   at [facility]  bleeding post surgery led to ex lap a few days after appy.     RADIOLOGY WITH ANESTHESIA N/A 01/17/2020   Procedure: MRI WITH ANESTHESIA LUMBAR SPINE WITHOUT CONTRAST;  Surgeon: Radiologist, Medication, MD;  Location: [facility] OR;  Service: Radiology;  Laterality: N/A;   RADIOLOGY WITH ANESTHESIA N/A 05/27/2020   Procedure: MRI WITH ANESTHESIA LUMBAR WITHOUT CONTRAST AND LEFT HIP WITHOUT CONTRAST;  Surgeon: Radiologist, Medication, MD;  Location: [facility] OR;   Service: Radiology;  Laterality: N/A;   RADIOLOGY WITH ANESTHESIA N/A 02/12/2021   Procedure: MRI WITH ANESTHESIA OF LUMBAR SPINE WITHOUT CONTRAST;  Surgeon: Radiologist, Medication, MD;  Location: [facility] OR;  Service: Radiology;  Laterality: N/A;   SHOULDER SURGERY     SHOULDER SURGERY Bilateral    UPPER GASTROINTESTINAL ENDOSCOPY     Family History  Adopted: Yes   Social History:  reports that he quit smoking about 7 years ago. His smoking use included cigarettes. He has never used smokeless tobacco. He reports that he does not currently use alcohol. He reports that he does not currently use drugs after having used the following drugs: Heroin and Other-see comments.  Allergies:  Allergies  Allergen Reactions   Iodinated Contrast Media Hives, Itching and Rash   Shellfish Allergy Anaphylaxis   Vancomycin Anaphylaxis and Hives    Throat Swelling per patient    Nsaids Nausea And Vomiting and Other (See Comments)    GI distress    Bee Venom Swelling   Penicillins Other (See Comments)    Childhood/ unknown   Metoclopramide Diarrhea   Ondansetron Palpitations   Medications: I have reviewed the patient's current medications. Prior to Admission: (Not in a hospital admission)  Scheduled:  fentaNYL (SUBLIMAZE) injection  50 mcg Intravenous Once   hydrALAZINE  20 mg Intravenous Once   [START ON 11/12/2022] pantoprazole  40 mg Intravenous Q12H   prochlorperazine  10 mg Intravenous Once   Continuous:  sodium chloride 150 mL/hr at 11/08/22 1450   pantoprazole (PROTONIX) IV 80 mg (11/08/22 1448)   pantoprazole     promethazine (PHENERGAN) injection (IM or IVPB) 12.5 mg (11/08/22 1159)   EXB:[id number] **OR** acetaminophen, promethazine (PHENERGAN) injection (IM or IVPB)  Results for orders placed or performed during the hospital encounter of 11/08/22 (from the past 48 hour(s))  Comprehensive metabolic panel     Status: Abnormal   Collection Time: 11/08/22 11:35 AM  Result Value Ref  Range   Sodium 139 135 - 145 mmol/L   Potassium 4.4 3.5 - 5.1 mmol/L   Chloride 108 98 - 111 mmol/L   CO2 23 22 - 32 mmol/L   Glucose, Bld 103 (H) 70 - 99 mg/dL    Comment: Glucose reference range applies only to samples taken after fasting for at least 8 hours.   BUN 19 6 - 20 mg/dL   Creatinine, Ser 2.53 0.61 - 1.24 mg/dL   Calcium 9.0 8.9 - 66.4 mg/dL   Total Protein 8.3 (H) 6.5 - 8.1 g/dL   Albumin 3.9 3.5 - 5.0 g/dL   AST 40 15 - 41 U/L   ALT 50 (H) 0 - 44 U/L   Alkaline Phosphatase 68 38 - 126  U/L   Total Bilirubin 0.5 0.3 - 1.2 mg/dL   GFR, Estimated >40 >98 mL/min    Comment: (NOTE) Calculated using the CKD-EPI Creatinine Equation (2021)    Anion gap 8 5 - 15    Comment: Performed at [facility], [address], [place], [place] [address]  CBC with Differential     Status: None   Collection Time: 11/08/22 11:35 AM  Result Value Ref Range   WBC 6.3 4.0 - 10.5 K/uL   RBC 4.76 4.22 - 5.81 MIL/uL   Hemoglobin 13.2 13.0 - 17.0 g/dL   HCT 78.2 95.6 - 21.3 %   MCV 86.3 80.0 - 100.0 fL   MCH 27.7 26.0 - 34.0 pg   MCHC 32.1 30.0 - 36.0 g/dL   RDW 08.6 57.8 - 46.9 %   Platelets 314 150 - 400 K/uL   nRBC 0.0 0.0 - 0.2 %   Neutrophils Relative % 60 %   Neutro Abs 3.8 1.7 - 7.7 K/uL   Lymphocytes Relative 29 %   Lymphs Abs 1.8 0.7 - 4.0 K/uL   Monocytes Relative 7 %   Monocytes Absolute 0.5 0.1 - 1.0 K/uL   Eosinophils Relative 3 %   Eosinophils Absolute 0.2 0.0 - 0.5 K/uL   Basophils Relative 1 %   Basophils Absolute 0.0 0.0 - 0.1 K/uL   Immature Granulocytes 0 %   Abs Immature Granulocytes 0.02 0.00 - 0.07 K/uL    Comment: Performed at [facility], [address], [place], [place] [address]  Lipase, blood     Status: None   Collection Time: 11/08/22 11:35 AM  Result Value Ref Range   Lipase 21 11 - 51 U/L    Comment: Performed at [facility], [address], [place], [place] [address]  Ethanol     Status:  None   Collection Time: 11/08/22 11:35 AM  Result Value Ref Range   Alcohol, Ethyl (B) <10 <10 mg/dL    Comment: (NOTE) Lowest detectable limit for serum alcohol is 10 mg/dL.  For medical purposes only. Performed at [facility], [address], [place], [place] [address]    DG Chest 2 View  Result Date: 11/08/2022 CLINICAL DATA:  Cough.  Evaluate for pneumonia. EXAM: CHEST - 2 VIEW COMPARISON:  Chest radiograph 08/05/2019. Lung bases from abdominal CT 11/03/2022 FINDINGS: No radiographic correlate for the ground-glass opacities at the right lung base on prior CT. There is no confluent airspace disease. The heart is normal in size. Mediastinal contours are normal. No pleural effusion or pneumothorax. No pulmonary edema. No acute osseous findings. IMPRESSION: No radiographic correlate for the ground-glass opacities at the right lung base on prior CT. No confluent airspace disease. Electronically Signed   By: Narda Rutherford M.D.   On: 11/08/2022 13:19    Review of Systems  Constitutional:  Negative for appetite change, chills, diaphoresis, fatigue, fever and unexpected weight change.  HENT: Negative.    Eyes: Negative.   Respiratory: Negative.    Cardiovascular: Negative.   Gastrointestinal:  Positive for abdominal pain, nausea and vomiting. Negative for abdominal distention, anal bleeding, blood in stool, constipation and rectal pain.  Endocrine: Negative.   Genitourinary: Negative.   Allergic/Immunologic: Negative.   Neurological: Negative.   Hematological: Negative.   Psychiatric/Behavioral:  Positive for agitation and dysphoric mood. The patient is nervous/anxious and is hyperactive.    Blood pressure (!) 165/122, pulse 99, temperature 98.4 F (36.9 C), temperature source  Oral, resp. rate 17, SpO2 98%. Physical Exam Constitutional:      General: He is in acute distress.     Appearance: He is obese. He is not ill-appearing, toxic-appearing or diaphoretic.   HENT:     Head: Normocephalic and atraumatic.     Comments: The teeth in poor repair    Mouth/Throat:     Mouth: Mucous membranes are dry.  Eyes:     Extraocular Movements: Extraocular movements intact.     Pupils: Pupils are equal, round, and reactive to light.  Cardiovascular:     Rate and Rhythm: Normal rate and regular rhythm.     Pulses: Normal pulses.     Heart sounds: Normal heart sounds.  Abdominal:     General: There is no distension.     Palpations: There is no mass.     Tenderness: There is abdominal tenderness. There is no right CVA tenderness, left CVA tenderness, guarding or rebound.     Hernia: No hernia is present.  Musculoskeletal:     Cervical back: Normal range of motion and neck supple.  Skin:    General: Skin is warm and dry.  Neurological:     Mental Status: He is alert and oriented to person, place, and time.  Psychiatric:        Mood and Affect: Mood is anxious and depressed. Affect is angry.        Behavior: Behavior is agitated and hyperactive.        Cognition and Memory: Cognition and memory normal.        Judgment: Judgment is impulsive.   Assessment/Plan: 1) Acute epigastric pain with nausea vomiting and hematemesis-vital signs are stable hemoglobin is 13.2 g/dL-plans are to do EGD tomorrow. This is scheduled for 2 PM rule out peptic ulcer disease versus Mallory-Weiss tear. 2) GERD. 3) History of gastroparesis. 4) Anxiety/depression/Bipolar disorder/heroin abuse. 5) History of Hepatitis C. 6) History of seizure disorder.  Charna Elizabeth 11/08/2022, 1:42 PM

## 2022-11-09 SURGERY — ESOPHAGOGASTRODUODENOSCOPY (EGD) WITH PROPOFOL
Anesthesia: Monitor Anesthesia Care
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
## 2024-01-04 ENCOUNTER — Emergency Department (HOSPITAL_COMMUNITY)

## 2024-01-04 ENCOUNTER — Emergency Department (HOSPITAL_COMMUNITY)
Admission: EM | Admit: 2024-01-04 | Discharge: 2024-01-04 | Disposition: A | Discharge status: Home / Self Care | Attending: Emergency Medicine | Admitting: Emergency Medicine

## 2024-01-04 ENCOUNTER — Encounter (HOSPITAL_COMMUNITY): Payer: Self-pay

## 2024-01-04 ENCOUNTER — Other Ambulatory Visit: Payer: Self-pay

## 2024-01-04 DIAGNOSIS — R220 Localized swelling, mass and lump, head: Secondary | ICD-10-CM | POA: Diagnosis present

## 2024-01-04 DIAGNOSIS — L03213 Periorbital cellulitis: Secondary | ICD-10-CM | POA: Insufficient documentation

## 2024-01-04 LAB — CBC WITH DIFFERENTIAL/PLATELET
Abs Immature Granulocytes: 0.01 K/uL (ref 0.00–0.07)
Basophils Absolute: 0 K/uL (ref 0.0–0.1)
Basophils Relative: 0 %
Eosinophils Absolute: 0.2 K/uL (ref 0.0–0.5)
Eosinophils Relative: 2 %
HCT: 39.6 % (ref 39.0–52.0)
Hemoglobin: 13.1 g/dL (ref 13.0–17.0)
Immature Granulocytes: 0 %
Lymphocytes Relative: 28 %
Lymphs Abs: 2 K/uL (ref 0.7–4.0)
MCH: 27.7 pg (ref 26.0–34.0)
MCHC: 33.1 g/dL (ref 30.0–36.0)
MCV: 83.7 fL (ref 80.0–100.0)
Monocytes Absolute: 0.5 K/uL (ref 0.1–1.0)
Monocytes Relative: 7 %
Neutro Abs: 4.3 K/uL (ref 1.7–7.7)
Neutrophils Relative %: 63 %
Platelets: 244 K/uL (ref 150–400)
RBC: 4.73 MIL/uL (ref 4.22–5.81)
RDW: 14.2 % (ref 11.5–15.5)
WBC: 7 K/uL (ref 4.0–10.5)
nRBC: 0 % (ref 0.0–0.2)

## 2024-01-04 LAB — BASIC METABOLIC PANEL WITH GFR
Anion gap: 12 (ref 5–15)
BUN: 19 mg/dL (ref 6–20)
CO2: 24 mmol/L (ref 22–32)
Calcium: 9.4 mg/dL (ref 8.9–10.3)
Chloride: 103 mmol/L (ref 98–111)
Creatinine, Ser: 0.74 mg/dL (ref 0.61–1.24)
GFR, Estimated: >60 mL/min (ref >60)
Glucose, Bld: 134 mg/dL — ABNORMAL HIGH (ref 70–99)
Potassium: 3.6 mmol/L (ref 3.5–5.1)
Sodium: 139 mmol/L (ref 135–145)

## 2024-01-04 LAB — LACTIC ACID, PLASMA: Lactic Acid, Venous: 1.5 mmol/L (ref 0.5–1.9)

## 2024-01-04 LAB — I-STAT CG4 LACTIC ACID, ED: Lactic Acid, Venous: 1.4 mmol/L (ref 0.5–1.9)

## 2024-01-04 MED ORDER — CEPHALEXIN 500 MG PO CAPS: 500.0000 mg | ORAL_CAPSULE | Freq: Three times a day (TID) | ORAL | 0 refills | Status: AC

## 2024-01-04 MED ORDER — SULFAMETHOXAZOLE-TRIMETHOPRIM 800-160 MG PO TABS: 1.0000 | ORAL_TABLET | Freq: Two times a day (BID) | ORAL | 0 refills | Status: AC

## 2024-01-04 MED ORDER — CLINDAMYCIN PHOSPHATE 600 MG/50ML IV SOLN
600.0000 mg | Freq: Once | INTRAVENOUS | Status: AC
  Administered 2024-01-04: 600 mg via INTRAVENOUS
  Filled 2024-01-04: qty 50

## 2024-01-04 MED ORDER — KETAMINE HCL 50 MG/5ML IJ SOSY
0.3000 mg/kg | PREFILLED_SYRINGE | Freq: Once | INTRAMUSCULAR | Status: AC
  Administered 2024-01-04: 31 mg via INTRAVENOUS
  Filled 2024-01-04: qty 5

## 2024-01-04 NOTE — ED Provider Triage Note (Signed)
 Emergency Medicine Provider Triage Evaluation Note  Walter Kemp , a 33 y.o. male  was evaluated in triage.  Pt complains of left eye swelling that started this morning since picking at pimple at 2000 last night. Notice drainage from eye. 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 Endorses some chills  Review of Systems  Positive: See hpi Negative:   Physical Exam  BP (!) 152/114   Pulse 95   Temp 98.3 F (36.8 C)   Resp 18   SpO2 100%  Gen:   Awake, no distress   Resp:  Normal effort  MSK:   Moves extremities without difficulty  Other:  Significant swelling, erythema, warmth to left upper eye  Medical Decision Making  Medically screening exam initiated at 6:29 PM.  Appropriate orders placed.  Walter Kemp was informed that the remainder of the evaluation will be completed by another provider, this initial triage assessment does not replace that evaluation, and the importance of remaining in the ED until their evaluation is complete.  Labs and ct to r/o orbital cellulitis ordered. Has contrast study allergy so obtained noncon   Walter Kemp, Walter Kemp 01/04/24 [phone]

## 2024-01-04 NOTE — ED Provider Notes (Signed)
 [facility] Provider Note   CSN: [id number] Arrival date & time: 01/04/24  1814     Patient presents with: Eye Problem   Walter Kemp is a 33 y.o. male history of IV drug use, PTSD, here presenting with left upper eyelid swelling.  Patient is in jail currently.  He states that he had a pimple above the left eyebrow and he popped it yesterday.  He states that now the left upper eyelid is swollen.  He denies any purulent discharge from the eye.    The history is provided by the patient.       Prior to Admission medications   Medication Sig Start Date End Date Taking? Authorizing Provider  acetaminophen  (TYLENOL ) 500 MG tablet Take 1,000 mg by mouth every 6 (six) hours as needed for moderate pain.    [provider]  ARIPiprazole  (ABILIFY ) 10 MG tablet Take 10 mg by mouth daily.    [provider]  buprenorphine-naloxone (SUBOXONE) 8-2 mg SUBL SL tablet Place 1 tablet under the tongue 3 (three) times daily as needed (pain).    [provider]  buPROPion  (WELLBUTRIN  SR) 150 MG 12 hr tablet Take 150 mg by mouth daily.    [provider]  clonazePAM  (KLONOPIN ) 1 MG tablet Take 1 mg by mouth daily as needed for anxiety.    [provider]  eszopiclone (LUNESTA) 1 MG TABS tablet Take 1 mg by mouth at bedtime. Take immediately before bedtime    [provider]  gabapentin (NEURONTIN) 800 MG tablet Take 800 mg by mouth 3 (three) times daily.  07/12/19 02/12/21  [provider]  ibuprofen (ADVIL) 200 MG tablet Take 800 mg by mouth every 8 (eight) hours as needed for moderate pain.    [provider]  Melatonin 10 MG TABS Take 10 mg by mouth at bedtime.    [provider]  methocarbamol  (ROBAXIN ) 500 MG tablet Take 1 tablet (500 mg total) by mouth 2 (two) times daily. 02/26/22   Beverley Leita LABOR, PA-C  NARCAN 4 MG/0.1ML LIQD nasal spray kit Place 1 spray into the nose as  needed (overdose). 04/30/19   [provider]  pantoprazole  (PROTONIX ) 40 MG tablet Take 1 tablet (40 mg total) by mouth 2 (two) times daily. 11/03/22   Kommor, [place], MD  tiZANidine  (ZANAFLEX ) 4 MG tablet Take 1 tablet (4 mg total) by mouth every 6 (six) hours as needed for muscle spasms. Patient taking differently: Take 4 mg by mouth every 8 (eight) hours as needed for muscle spasms. 08/30/19   Christopher Savannah, PA-C  omeprazole (PRILOSEC) 20 MG capsule Take 20 mg by mouth daily.  07/13/19  [provider]    Allergies: Iodinated contrast media, Shellfish allergy, Vancomycin, Benzodiazepines, Buspirone, Hydrochlorothiazide, Lisinopril, Nsaids, Bee venom, Penicillins, Metoclopramide , and Ondansetron     Review of Systems  Eyes:  Positive for redness.  All other systems reviewed and are negative.   Updated Vital Signs BP (!) 152/114   Pulse 95   Temp 98.3 F (36.8 C)   Resp 18   SpO2 100%   Physical Exam Vitals and nursing note reviewed.  HENT:     Head: Normocephalic.     Nose: Nose normal.     Mouth/Throat:     Mouth: Mucous membranes are moist.  Eyes:     Comments: Patient has obvious swelling of the left upper eyelid.  No obvious fluctuance.  Patient does have a pimple above  the left eyelid that is draining already.  Patient has no obvious conjunctivitis.  Extraocular movements are intact.  Cardiovascular:     Rate and Rhythm: Normal rate and regular rhythm.     Pulses: Normal pulses.     Heart sounds: Normal heart sounds.  Pulmonary:     Effort: Pulmonary effort is normal.     Breath sounds: Normal breath sounds.  Abdominal:     General: Abdomen is flat.     Palpations: Abdomen is soft.  Musculoskeletal:        General: Normal range of motion.     Cervical back: Normal range of motion and neck supple.  Skin:    General: Skin is warm.     Capillary Refill: Capillary refill takes less than 2 seconds.  Neurological:     General: No focal deficit present.      Mental Status: He is alert and oriented to person, place, and time.  Psychiatric:        Mood and Affect: Mood normal.        Behavior: Behavior normal.     (all labs ordered are listed, but only abnormal results are displayed) Labs Reviewed  BASIC METABOLIC PANEL WITH GFR - Abnormal; Notable for the following components:      Result Value   Glucose, Bld 134 (*)    All other components within normal limits  CBC WITH DIFFERENTIAL/PLATELET  I-STAT CG4 LACTIC ACID, ED    EKG: None  Radiology: No results found.   Procedures   Medications Ordered in the ED  ketamine  (KETALAR ) injection 0.3 mg/kg (has no administration in time range)  clindamycin  (CLEOCIN ) IVPB 600 mg (has no administration in time range)                                    Medical Decision Making Walter Kemp is a 33 y.o. male here presenting with left upper eyelid swelling.  Patient is in jail and has done IV drug use.  Concern for possible MRSA infection of the left upper eyelid.  Patient unfortunately has IV contrast dye allergy and also allergy to vancomycin.  Will check CBC and BMP and CT noncontrast of the maxillofacial.  Will also give IV clindamycin .  9:32 PM Reviewed patient's labs and CBC and BMP unremarkable.  CT maxillofacial showed left periorbital edema and no obvious abscess.  Patient given IV clindamycin .  Will discharge back to jail with Keflex and Bactrim.  Problems Addressed: Periorbital cellulitis of left eye: acute illness or injury  Amount and/or Complexity of Data Reviewed Labs: ordered.  Risk Prescription drug management.    Final diagnoses:  None    ED Discharge Orders     None          Walter Alm Macho, MD 01/04/24 2133

## 2024-01-04 NOTE — Discharge Instructions (Addendum)
 You have periorbital cellulitis  I have ordered Keflex 500 mg 3 times daily for a week  I have also prescribed Bactrim 1 tablet twice a day for a week  You may take Tylenol  500 mg every 4 hours as needed for pain  Please follow-up with your doctor  Return to ER if you have worsening eye pain or swelling or fever

## 2024-01-04 NOTE — ED Triage Notes (Signed)
 Pt here from jail- states he had a pimple over his left eye and popped it yesterday, this morning woke up with redness and swelling, unable to open left eye.
